# Patient Record
Sex: Male | Born: 1960 | Race: White | Hispanic: No | Marital: Single | Smoking: Current every day smoker
Health system: Southern US, Community
[De-identification: ages and names within clinical notes are randomized; demographics above are authoritative.]

## PROBLEM LIST (undated history)

## (undated) DIAGNOSIS — K759 Inflammatory liver disease, unspecified: Secondary | ICD-10-CM

## (undated) DIAGNOSIS — Z8619 Personal history of other infectious and parasitic diseases: Secondary | ICD-10-CM

## (undated) DIAGNOSIS — I82409 Acute embolism and thrombosis of unspecified deep veins of unspecified lower extremity: Secondary | ICD-10-CM

## (undated) HISTORY — PX: TONSILLECTOMY: SUR1361

## (undated) HISTORY — PX: APPENDECTOMY: SHX54

---

## 1980-05-07 HISTORY — PX: NECK SURGERY: SHX720

## 1998-04-19 ENCOUNTER — Emergency Department (HOSPITAL_COMMUNITY): Admission: EM | Admit: 1998-04-19 | Discharge: 1998-04-19 | Payer: Self-pay | Admitting: Emergency Medicine

## 2007-05-08 HISTORY — PX: IVC FILTER PLACEMENT (ARMC HX): HXRAD1551

## 2007-05-08 HISTORY — PX: LUNG SURGERY: SHX703

## 2008-07-31 ENCOUNTER — Emergency Department: Payer: Self-pay | Admitting: Emergency Medicine

## 2009-02-04 ENCOUNTER — Ambulatory Visit: Payer: Self-pay | Admitting: Internal Medicine

## 2009-02-10 ENCOUNTER — Inpatient Hospital Stay: Payer: Self-pay | Admitting: Internal Medicine

## 2009-02-18 ENCOUNTER — Ambulatory Visit: Payer: Self-pay | Admitting: Internal Medicine

## 2009-02-25 ENCOUNTER — Ambulatory Visit: Payer: Self-pay | Admitting: General Surgery

## 2009-03-02 ENCOUNTER — Inpatient Hospital Stay: Payer: Self-pay | Admitting: General Surgery

## 2009-03-07 ENCOUNTER — Ambulatory Visit: Payer: Self-pay | Admitting: Internal Medicine

## 2009-03-17 ENCOUNTER — Ambulatory Visit: Payer: Self-pay | Admitting: General Surgery

## 2009-04-06 ENCOUNTER — Ambulatory Visit: Payer: Self-pay | Admitting: Internal Medicine

## 2009-05-07 ENCOUNTER — Ambulatory Visit: Payer: Self-pay | Admitting: Internal Medicine

## 2009-06-07 ENCOUNTER — Ambulatory Visit: Payer: Self-pay | Admitting: Internal Medicine

## 2009-07-05 ENCOUNTER — Ambulatory Visit: Payer: Self-pay | Admitting: Internal Medicine

## 2009-07-06 ENCOUNTER — Ambulatory Visit: Payer: Self-pay | Admitting: Internal Medicine

## 2009-08-05 ENCOUNTER — Ambulatory Visit: Payer: Self-pay | Admitting: Internal Medicine

## 2009-09-04 ENCOUNTER — Ambulatory Visit: Payer: Self-pay | Admitting: Internal Medicine

## 2009-10-05 ENCOUNTER — Ambulatory Visit: Payer: Self-pay | Admitting: Internal Medicine

## 2009-11-04 ENCOUNTER — Ambulatory Visit: Payer: Self-pay | Admitting: Internal Medicine

## 2009-12-05 ENCOUNTER — Ambulatory Visit: Payer: Self-pay | Admitting: Internal Medicine

## 2009-12-15 LAB — PSA

## 2010-01-05 ENCOUNTER — Ambulatory Visit: Payer: Self-pay | Admitting: Internal Medicine

## 2010-01-29 ENCOUNTER — Emergency Department: Payer: Self-pay | Admitting: Unknown Physician Specialty

## 2010-02-04 ENCOUNTER — Ambulatory Visit: Payer: Self-pay | Admitting: Internal Medicine

## 2010-03-07 ENCOUNTER — Ambulatory Visit: Payer: Self-pay | Admitting: Internal Medicine

## 2010-04-06 ENCOUNTER — Ambulatory Visit: Payer: Self-pay | Admitting: Internal Medicine

## 2010-05-07 ENCOUNTER — Ambulatory Visit: Payer: Self-pay | Admitting: Internal Medicine

## 2010-06-07 ENCOUNTER — Ambulatory Visit: Payer: Self-pay | Admitting: Internal Medicine

## 2010-07-06 ENCOUNTER — Ambulatory Visit: Payer: Self-pay | Admitting: Internal Medicine

## 2010-08-06 ENCOUNTER — Ambulatory Visit: Payer: Self-pay | Admitting: Internal Medicine

## 2010-09-05 ENCOUNTER — Ambulatory Visit: Payer: Self-pay | Admitting: Internal Medicine

## 2010-10-06 ENCOUNTER — Ambulatory Visit: Payer: Self-pay | Admitting: Internal Medicine

## 2010-11-05 ENCOUNTER — Ambulatory Visit: Payer: Self-pay | Admitting: Internal Medicine

## 2010-11-21 ENCOUNTER — Emergency Department: Payer: Self-pay | Admitting: Emergency Medicine

## 2010-12-06 ENCOUNTER — Ambulatory Visit: Payer: Self-pay | Admitting: Internal Medicine

## 2011-01-06 ENCOUNTER — Ambulatory Visit: Payer: Self-pay | Admitting: Internal Medicine

## 2011-10-25 ENCOUNTER — Inpatient Hospital Stay: Payer: Self-pay | Admitting: Internal Medicine

## 2011-10-25 LAB — BASIC METABOLIC PANEL
Anion Gap: 6 — ABNORMAL LOW (ref 7–16)
BUN: 17 mg/dL (ref 7–18)
Calcium, Total: 9.5 mg/dL (ref 8.5–10.1)
Co2: 27 mmol/L (ref 21–32)
Creatinine: 1.18 mg/dL (ref 0.60–1.30)
EGFR (African American): >60
EGFR (Non-African Amer.): >60
Glucose: 94 mg/dL (ref 65–99)
Potassium: 4.5 mmol/L (ref 3.5–5.1)
Sodium: 134 mmol/L — ABNORMAL LOW (ref 136–145)

## 2011-10-25 LAB — CBC
MCH: 35 pg — ABNORMAL HIGH (ref 26.0–34.0)
MCV: 105 fL — ABNORMAL HIGH (ref 80–100)
Platelet: 85 10*3/uL — ABNORMAL LOW (ref 150–440)
RBC: 4.29 10*6/uL — ABNORMAL LOW (ref 4.40–5.90)
RDW: 14.2 % (ref 11.5–14.5)
WBC: 8.6 10*3/uL (ref 3.8–10.6)

## 2011-10-25 LAB — URINALYSIS, COMPLETE
Bilirubin,UR: NEGATIVE
Ketone: NEGATIVE
Nitrite: NEGATIVE
Ph: 5 (ref 4.5–8.0)
RBC,UR: 11 /HPF (ref 0–5)
Specific Gravity: 1.027 (ref 1.003–1.030)
Squamous Epithelial: 2
WBC UR: 23 /HPF (ref 0–5)

## 2011-10-25 LAB — APTT: Activated PTT: 30.5 secs (ref 23.6–35.9)

## 2011-10-25 LAB — HEPATIC FUNCTION PANEL A (ARMC)
Albumin: 3.8 g/dL (ref 3.4–5.0)
Alkaline Phosphatase: 85 U/L (ref 50–136)
Bilirubin, Direct: 0.2 mg/dL (ref 0.00–0.20)
Bilirubin,Total: 1.2 mg/dL — ABNORMAL HIGH (ref 0.2–1.0)
SGOT(AST): 26 U/L (ref 15–37)

## 2011-10-26 LAB — BASIC METABOLIC PANEL
BUN: 19 mg/dL — ABNORMAL HIGH (ref 7–18)
Calcium, Total: 8.8 mg/dL (ref 8.5–10.1)
Chloride: 102 mmol/L (ref 98–107)
Osmolality: 273 (ref 275–301)
Potassium: 3.5 mmol/L (ref 3.5–5.1)
Sodium: 135 mmol/L — ABNORMAL LOW (ref 136–145)

## 2011-10-26 LAB — CBC WITH DIFFERENTIAL/PLATELET
Basophil #: 0 10*3/uL (ref 0.0–0.1)
Basophil %: 0.5 %
HCT: 39.8 % — ABNORMAL LOW (ref 40.0–52.0)
HGB: 13.3 g/dL (ref 13.0–18.0)
MCV: 104 fL — ABNORMAL HIGH (ref 80–100)
Monocyte #: 0.5 x10 3/mm (ref 0.2–1.0)
Neutrophil %: 64.8 %
Platelet: 73 10*3/uL — ABNORMAL LOW (ref 150–440)
RBC: 3.84 10*6/uL — ABNORMAL LOW (ref 4.40–5.90)
RDW: 14.3 % (ref 11.5–14.5)
WBC: 6.9 10*3/uL (ref 3.8–10.6)

## 2011-10-26 LAB — LIPID PANEL
Cholesterol: 158 mg/dL (ref 0–200)
HDL Cholesterol: 53 mg/dL (ref 40–60)
Ldl Cholesterol, Calc: 74 mg/dL (ref 0–100)

## 2011-10-26 LAB — APTT: Activated PTT: 69.6 secs — ABNORMAL HIGH (ref 23.6–35.9)

## 2011-10-27 LAB — URINE CULTURE

## 2011-10-27 LAB — APTT: Activated PTT: 72.5 secs — ABNORMAL HIGH (ref 23.6–35.9)

## 2011-10-27 LAB — PROTIME-INR: Prothrombin Time: 12.9 secs (ref 11.5–14.7)

## 2011-10-28 LAB — PROTIME-INR: Prothrombin Time: 13.4 secs (ref 11.5–14.7)

## 2011-10-28 LAB — CBC WITH DIFFERENTIAL/PLATELET
Basophil #: 0 10*3/uL (ref 0.0–0.1)
HCT: 39.9 % — ABNORMAL LOW (ref 40.0–52.0)
HGB: 13.2 g/dL (ref 13.0–18.0)
MCH: 34.5 pg — ABNORMAL HIGH (ref 26.0–34.0)
MCHC: 33.1 g/dL (ref 32.0–36.0)
Monocyte #: 0.4 x10 3/mm (ref 0.2–1.0)
RBC: 3.84 10*6/uL — ABNORMAL LOW (ref 4.40–5.90)
RDW: 14 % (ref 11.5–14.5)
WBC: 5.8 10*3/uL (ref 3.8–10.6)

## 2011-10-29 LAB — APTT
Activated PTT: 130.2 secs — ABNORMAL HIGH (ref 23.6–35.9)
Activated PTT: 117.9 secs — ABNORMAL HIGH (ref 23.6–35.9)
Activated PTT: 75.4 secs — ABNORMAL HIGH (ref 23.6–35.9)

## 2011-10-29 LAB — PROTIME-INR: Prothrombin Time: 17.6 secs — ABNORMAL HIGH (ref 11.5–14.7)

## 2011-10-30 LAB — CBC WITH DIFFERENTIAL/PLATELET
Basophil #: 0 10*3/uL (ref 0.0–0.1)
Basophil %: 0.8 %
Eosinophil #: 0.2 10*3/uL (ref 0.0–0.7)
Eosinophil %: 5.2 %
HGB: 13 g/dL (ref 13.0–18.0)
Lymphocyte #: 1.5 10*3/uL (ref 1.0–3.6)
Monocyte #: 0.3 x10 3/mm (ref 0.2–1.0)
Monocyte %: 7.3 %
Neutrophil %: 54.7 %
Platelet: 152 10*3/uL (ref 150–440)
RBC: 3.79 10*6/uL — ABNORMAL LOW (ref 4.40–5.90)
RDW: 14.1 % (ref 11.5–14.5)
WBC: 4.7 10*3/uL (ref 3.8–10.6)

## 2011-10-30 LAB — PROTIME-INR
INR: 1.5
Prothrombin Time: 18.3 secs — ABNORMAL HIGH (ref 11.5–14.7)

## 2011-10-30 LAB — APTT: Activated PTT: 88 secs — ABNORMAL HIGH (ref 23.6–35.9)

## 2011-10-31 LAB — APTT: Activated PTT: 94.7 secs — ABNORMAL HIGH (ref 23.6–35.9)

## 2011-11-01 LAB — APTT
Activated PTT: 60 secs — ABNORMAL HIGH (ref 23.6–35.9)
Activated PTT: 95.4 secs — ABNORMAL HIGH (ref 23.6–35.9)

## 2011-11-02 LAB — PROTIME-INR: INR: 1.9

## 2011-11-02 LAB — APTT: Activated PTT: >160 secs (ref 23.6–35.9)

## 2011-11-03 LAB — APTT: Activated PTT: 92.3 secs — ABNORMAL HIGH (ref 23.6–35.9)

## 2011-11-14 ENCOUNTER — Ambulatory Visit: Payer: Self-pay | Admitting: Internal Medicine

## 2011-11-14 LAB — PROTIME-INR
INR: 2.6
Prothrombin Time: 27.8 secs — ABNORMAL HIGH (ref 11.5–14.7)

## 2011-11-28 LAB — PROTIME-INR
INR: 3
Prothrombin Time: 30.9 secs — ABNORMAL HIGH (ref 11.5–14.7)

## 2011-12-05 LAB — PROTIME-INR
INR: 3.8
Prothrombin Time: 37.6 secs — ABNORMAL HIGH (ref 11.5–14.7)

## 2011-12-06 ENCOUNTER — Ambulatory Visit: Payer: Self-pay | Admitting: Internal Medicine

## 2011-12-12 LAB — PROTIME-INR
INR: 2.7
Prothrombin Time: 29.1 secs — ABNORMAL HIGH (ref 11.5–14.7)

## 2011-12-19 LAB — HEPATIC FUNCTION PANEL A (ARMC)
Albumin: 3.4 g/dL (ref 3.4–5.0)
Alkaline Phosphatase: 67 U/L (ref 50–136)
Bilirubin, Direct: 0.1 mg/dL (ref 0.00–0.20)
Bilirubin,Total: 0.6 mg/dL (ref 0.2–1.0)
SGPT (ALT): 19 U/L (ref 12–78)

## 2011-12-19 LAB — CBC CANCER CENTER
Eosinophil #: 0 x10 3/mm (ref 0.0–0.7)
Eosinophil %: 0.8 %
MCH: 34.7 pg — ABNORMAL HIGH (ref 26.0–34.0)
Monocyte #: 0.4 x10 3/mm (ref 0.2–1.0)
Monocyte %: 7 %
Neutrophil %: 70 %
Platelet: 162 x10 3/mm (ref 150–440)
RBC: 4.43 10*6/uL (ref 4.40–5.90)
WBC: 5.4 x10 3/mm (ref 3.8–10.6)

## 2011-12-19 LAB — CREATININE, SERUM
Creatinine: 1.08 mg/dL (ref 0.60–1.30)
EGFR (Non-African Amer.): >60

## 2011-12-19 LAB — PROTIME-INR
INR: 1.3
Prothrombin Time: 16.7 secs — ABNORMAL HIGH (ref 11.5–14.7)

## 2011-12-28 LAB — PROTIME-INR
INR: 3.2
Prothrombin Time: 32.8 secs — ABNORMAL HIGH (ref 11.5–14.7)

## 2012-01-06 ENCOUNTER — Ambulatory Visit: Payer: Self-pay | Admitting: Internal Medicine

## 2012-01-09 LAB — PROTIME-INR: Prothrombin Time: 35.3 secs — ABNORMAL HIGH (ref 11.5–14.7)

## 2012-01-16 LAB — PROTIME-INR: INR: 2

## 2012-01-23 LAB — PROTIME-INR
INR: 1.9
Prothrombin Time: 21.9 secs — ABNORMAL HIGH (ref 11.5–14.7)

## 2012-02-05 ENCOUNTER — Ambulatory Visit: Payer: Self-pay | Admitting: Internal Medicine

## 2012-02-15 LAB — PROTIME-INR
INR: 1.8
Prothrombin Time: 21.4 secs — ABNORMAL HIGH (ref 11.5–14.7)

## 2012-02-15 LAB — CBC CANCER CENTER
Basophil #: 0 x10 3/mm (ref 0.0–0.1)
Basophil %: 1.3 %
Lymphocyte #: 1.3 x10 3/mm (ref 1.0–3.6)
MCH: 33.4 pg (ref 26.0–34.0)
MCHC: 32.7 g/dL (ref 32.0–36.0)
MCV: 102 fL — ABNORMAL HIGH (ref 80–100)
Monocyte #: 0.3 x10 3/mm (ref 0.2–1.0)
Monocyte %: 9.8 %
Neutrophil #: 1.6 x10 3/mm (ref 1.4–6.5)
RBC: 4.29 10*6/uL — ABNORMAL LOW (ref 4.40–5.90)
RDW: 16.8 % — ABNORMAL HIGH (ref 11.5–14.5)

## 2012-02-15 LAB — HEPATIC FUNCTION PANEL A (ARMC)
Albumin: 3.5 g/dL (ref 3.4–5.0)
Bilirubin, Direct: 0.1 mg/dL (ref 0.00–0.20)
Bilirubin,Total: 0.4 mg/dL (ref 0.2–1.0)
SGPT (ALT): 29 U/L (ref 12–78)
Total Protein: 7.9 g/dL (ref 6.4–8.2)

## 2012-02-15 LAB — CREATININE, SERUM
Creatinine: 1.17 mg/dL (ref 0.60–1.30)
EGFR (African American): >60

## 2012-02-22 LAB — PROTIME-INR
INR: 2.4
Prothrombin Time: 26.7 s — ABNORMAL HIGH

## 2012-03-07 ENCOUNTER — Ambulatory Visit: Payer: Self-pay | Admitting: Internal Medicine

## 2012-03-07 LAB — PROTIME-INR: Prothrombin Time: 32.1 secs — ABNORMAL HIGH (ref 11.5–14.7)

## 2012-03-21 LAB — PROTIME-INR: INR: 2.6

## 2012-04-06 ENCOUNTER — Ambulatory Visit: Payer: Self-pay | Admitting: Internal Medicine

## 2012-04-17 LAB — PROTIME-INR: INR: 2.6

## 2012-05-02 LAB — PROTIME-INR
INR: 2.1
Prothrombin Time: 23.6 secs — ABNORMAL HIGH (ref 11.5–14.7)

## 2012-05-07 ENCOUNTER — Ambulatory Visit: Payer: Self-pay | Admitting: Internal Medicine

## 2012-05-16 ENCOUNTER — Ambulatory Visit: Payer: Self-pay | Admitting: Internal Medicine

## 2012-05-16 LAB — CREATININE, SERUM
Creatinine: 1.03 mg/dL (ref 0.60–1.30)
EGFR (African American): >60
EGFR (Non-African Amer.): >60

## 2012-05-16 LAB — HEPATIC FUNCTION PANEL A (ARMC)
Albumin: 3.7 g/dL (ref 3.4–5.0)
Bilirubin,Total: 0.6 mg/dL (ref 0.2–1.0)
SGOT(AST): 23 U/L (ref 15–37)
SGPT (ALT): 21 U/L (ref 12–78)
Total Protein: 7.6 g/dL (ref 6.4–8.2)

## 2012-05-16 LAB — CBC CANCER CENTER
Basophil #: 0 x10 3/mm (ref 0.0–0.1)
Basophil %: 0.6 %
Eosinophil %: 1.2 %
HCT: 42.1 % (ref 40.0–52.0)
HGB: 14.2 g/dL (ref 13.0–18.0)
Lymphocyte #: 1.2 x10 3/mm (ref 1.0–3.6)
Lymphocyte %: 25.1 %
MCHC: 33.7 g/dL (ref 32.0–36.0)
Monocyte #: 0.2 x10 3/mm (ref 0.2–1.0)
Neutrophil #: 3.3 x10 3/mm (ref 1.4–6.5)
Platelet: 143 x10 3/mm — ABNORMAL LOW (ref 150–440)
RBC: 3.89 10*6/uL — ABNORMAL LOW (ref 4.40–5.90)
RDW: 16.1 % — ABNORMAL HIGH (ref 11.5–14.5)

## 2012-05-16 LAB — PROTIME-INR: Prothrombin Time: 23 secs — ABNORMAL HIGH (ref 11.5–14.7)

## 2012-06-06 LAB — CBC
MCHC: 32.8 g/dL (ref 32.0–36.0)
RDW: 16.1 % — ABNORMAL HIGH (ref 11.5–14.5)
WBC: 5.1 10*3/uL (ref 3.8–10.6)

## 2012-06-06 LAB — COMPREHENSIVE METABOLIC PANEL
Albumin: 3.4 g/dL (ref 3.4–5.0)
Alkaline Phosphatase: 72 U/L (ref 50–136)
BUN: 9 mg/dL (ref 7–18)
Bilirubin,Total: 0.6 mg/dL (ref 0.2–1.0)
Chloride: 106 mmol/L (ref 98–107)
Glucose: 88 mg/dL (ref 65–99)
SGOT(AST): 29 U/L (ref 15–37)
Sodium: 138 mmol/L (ref 136–145)
Total Protein: 7 g/dL (ref 6.4–8.2)

## 2012-06-06 LAB — PROTIME-INR: Prothrombin Time: 27.2 secs — ABNORMAL HIGH (ref 11.5–14.7)

## 2012-06-06 LAB — APTT: Activated PTT: 43.6 secs — ABNORMAL HIGH (ref 23.6–35.9)

## 2012-06-07 ENCOUNTER — Observation Stay: Payer: Self-pay | Admitting: Student

## 2012-06-07 ENCOUNTER — Ambulatory Visit: Payer: Self-pay | Admitting: Internal Medicine

## 2012-06-08 LAB — PROTIME-INR: Prothrombin Time: 27.1 secs — ABNORMAL HIGH (ref 11.5–14.7)

## 2012-06-10 ENCOUNTER — Ambulatory Visit: Payer: Self-pay | Admitting: Internal Medicine

## 2012-06-13 LAB — CBC CANCER CENTER
Basophil %: 1.4 %
HGB: 13.9 g/dL (ref 13.0–18.0)
Lymphocyte %: 31.2 %
MCHC: 33.3 g/dL (ref 32.0–36.0)
MCV: 109 fL — ABNORMAL HIGH (ref 80–100)
Monocyte #: 0.4 x10 3/mm (ref 0.2–1.0)
Monocyte %: 9.4 %
Neutrophil #: 2.3 x10 3/mm (ref 1.4–6.5)
Platelet: 187 x10 3/mm (ref 150–440)
RDW: 15.7 % — ABNORMAL HIGH (ref 11.5–14.5)

## 2012-06-13 LAB — CREATININE, SERUM
Creatinine: 1.04 mg/dL (ref 0.60–1.30)
EGFR (African American): >60

## 2012-07-05 ENCOUNTER — Ambulatory Visit: Payer: Self-pay | Admitting: Internal Medicine

## 2012-07-18 ENCOUNTER — Ambulatory Visit: Payer: Self-pay | Admitting: Internal Medicine

## 2012-07-18 LAB — CREATININE, SERUM
Creatinine: 1.14 mg/dL (ref 0.60–1.30)
EGFR (African American): >60

## 2012-07-18 LAB — HEPATIC FUNCTION PANEL A (ARMC)
Alkaline Phosphatase: 66 U/L (ref 50–136)
Bilirubin,Total: 0.3 mg/dL (ref 0.2–1.0)
SGOT(AST): 59 U/L — ABNORMAL HIGH (ref 15–37)
SGPT (ALT): 76 U/L (ref 12–78)

## 2012-07-18 LAB — CBC CANCER CENTER
Basophil #: 0.1 x10 3/mm (ref 0.0–0.1)
Basophil %: 1.4 %
Eosinophil %: 3.3 %
Lymphocyte %: 32 %
MCHC: 33.6 g/dL (ref 32.0–36.0)
Monocyte #: 0.4 x10 3/mm (ref 0.2–1.0)
RBC: 3.85 10*6/uL — ABNORMAL LOW (ref 4.40–5.90)
RDW: 13.8 % (ref 11.5–14.5)
WBC: 4.1 x10 3/mm (ref 3.8–10.6)

## 2012-08-01 ENCOUNTER — Emergency Department: Payer: Self-pay | Admitting: Emergency Medicine

## 2012-08-01 LAB — BASIC METABOLIC PANEL
Anion Gap: 4 — ABNORMAL LOW (ref 7–16)
BUN: 17 mg/dL (ref 7–18)
Chloride: 102 mmol/L (ref 98–107)
Creatinine: 1.01 mg/dL (ref 0.60–1.30)
EGFR (African American): >60
EGFR (Non-African Amer.): >60
Glucose: 97 mg/dL (ref 65–99)
Potassium: 3.5 mmol/L (ref 3.5–5.1)

## 2012-08-01 LAB — CBC
HCT: 40.1 % (ref 40.0–52.0)
HGB: 13.4 g/dL (ref 13.0–18.0)
MCH: 34.8 pg — ABNORMAL HIGH (ref 26.0–34.0)
MCHC: 33.5 g/dL (ref 32.0–36.0)
MCV: 104 fL — ABNORMAL HIGH (ref 80–100)
RDW: 14.2 % (ref 11.5–14.5)
WBC: 6.2 10*3/uL (ref 3.8–10.6)

## 2012-08-01 LAB — TROPONIN I: Troponin-I: <0.02 ng/mL

## 2012-08-05 ENCOUNTER — Ambulatory Visit: Payer: Self-pay | Admitting: Internal Medicine

## 2012-08-29 LAB — CBC CANCER CENTER
Basophil %: 0.9 %
Eosinophil #: 0.1 x10 3/mm (ref 0.0–0.7)
Eosinophil %: 3.6 %
HCT: 41.5 % (ref 40.0–52.0)
Lymphocyte #: 0.9 x10 3/mm — ABNORMAL LOW (ref 1.0–3.6)
Lymphocyte %: 24.5 %
MCH: 33.9 pg (ref 26.0–34.0)
MCHC: 33.1 g/dL (ref 32.0–36.0)
MCV: 103 fL — ABNORMAL HIGH (ref 80–100)
Monocyte #: 0.3 x10 3/mm (ref 0.2–1.0)
Monocyte %: 7.5 %
Neutrophil #: 2.2 x10 3/mm (ref 1.4–6.5)
Neutrophil %: 63.5 %
Platelet: 100 x10 3/mm — ABNORMAL LOW (ref 150–440)
RDW: 15 % — ABNORMAL HIGH (ref 11.5–14.5)

## 2012-08-29 LAB — HEPATIC FUNCTION PANEL A (ARMC)
Albumin: 3.3 g/dL — ABNORMAL LOW (ref 3.4–5.0)
Alkaline Phosphatase: 60 U/L (ref 50–136)
Bilirubin, Direct: 0.1 mg/dL (ref 0.00–0.20)
Bilirubin,Total: 0.5 mg/dL (ref 0.2–1.0)
SGOT(AST): 25 U/L (ref 15–37)
SGPT (ALT): 27 U/L (ref 12–78)

## 2012-08-29 LAB — CREATININE, SERUM
Creatinine: 1.24 mg/dL (ref 0.60–1.30)
EGFR (African American): >60
EGFR (Non-African Amer.): >60

## 2012-09-04 ENCOUNTER — Ambulatory Visit: Payer: Self-pay | Admitting: Internal Medicine

## 2012-09-19 ENCOUNTER — Observation Stay: Payer: Self-pay | Admitting: Internal Medicine

## 2012-09-19 LAB — PROTIME-INR
INR: 0.9
Prothrombin Time: 12.4 secs (ref 11.5–14.7)

## 2012-09-19 LAB — CBC WITH DIFFERENTIAL/PLATELET
Eosinophil %: 0.8 %
HCT: 39.5 % — ABNORMAL LOW (ref 40.0–52.0)
Lymphocyte #: 1 10*3/uL (ref 1.0–3.6)
Lymphocyte %: 14 %
MCHC: 34.3 g/dL (ref 32.0–36.0)
MCV: 103 fL — ABNORMAL HIGH (ref 80–100)
Monocyte #: 0.5 x10 3/mm (ref 0.2–1.0)
Monocyte %: 6.9 %
Neutrophil #: 5.3 10*3/uL (ref 1.4–6.5)
Neutrophil %: 77.2 %
RBC: 3.83 10*6/uL — ABNORMAL LOW (ref 4.40–5.90)
RDW: 15.8 % — ABNORMAL HIGH (ref 11.5–14.5)
WBC: 6.8 10*3/uL (ref 3.8–10.6)

## 2012-09-19 LAB — COMPREHENSIVE METABOLIC PANEL
Anion Gap: 4 — ABNORMAL LOW (ref 7–16)
BUN: 13 mg/dL (ref 7–18)
Bilirubin,Total: 0.5 mg/dL (ref 0.2–1.0)
Co2: 28 mmol/L (ref 21–32)
EGFR (African American): >60
Osmolality: 267 (ref 275–301)
SGOT(AST): 14 U/L — ABNORMAL LOW (ref 15–37)
SGPT (ALT): 20 U/L (ref 12–78)
Sodium: 133 mmol/L — ABNORMAL LOW (ref 136–145)
Total Protein: 8.1 g/dL (ref 6.4–8.2)

## 2012-09-19 LAB — APTT: Activated PTT: 37.8 secs — ABNORMAL HIGH (ref 23.6–35.9)

## 2012-09-20 LAB — CBC WITH DIFFERENTIAL/PLATELET
Basophil #: 0 10*3/uL (ref 0.0–0.1)
Basophil %: 0.9 %
Eosinophil %: 2.1 %
HGB: 12.3 g/dL — ABNORMAL LOW (ref 13.0–18.0)
MCV: 102 fL — ABNORMAL HIGH (ref 80–100)
Monocyte #: 0.5 x10 3/mm (ref 0.2–1.0)
Monocyte %: 8.9 %
Neutrophil %: 69.3 %
Platelet: 121 10*3/uL — ABNORMAL LOW (ref 150–440)
RBC: 3.51 10*6/uL — ABNORMAL LOW (ref 4.40–5.90)
WBC: 5.4 10*3/uL (ref 3.8–10.6)

## 2012-09-25 LAB — CULTURE, BLOOD (SINGLE)

## 2012-09-30 LAB — CBC CANCER CENTER
Eosinophil #: 0.1 x10 3/mm (ref 0.0–0.7)
Eosinophil %: 1.3 %
HCT: 39.9 % — ABNORMAL LOW (ref 40.0–52.0)
HGB: 13.5 g/dL (ref 13.0–18.0)
Lymphocyte %: 25.2 %
MCH: 34.5 pg — ABNORMAL HIGH (ref 26.0–34.0)
MCV: 102 fL — ABNORMAL HIGH (ref 80–100)
Monocyte #: 0.3 x10 3/mm (ref 0.2–1.0)
Monocyte %: 5.9 %
Neutrophil %: 66.8 %
RDW: 16 % — ABNORMAL HIGH (ref 11.5–14.5)

## 2012-09-30 LAB — HEPATIC FUNCTION PANEL A (ARMC)
Albumin: 3.2 g/dL — ABNORMAL LOW (ref 3.4–5.0)
Alkaline Phosphatase: 63 U/L (ref 50–136)
Bilirubin, Direct: 0.1 mg/dL (ref 0.00–0.20)
Bilirubin,Total: 0.3 mg/dL (ref 0.2–1.0)
SGPT (ALT): 24 U/L (ref 12–78)

## 2012-09-30 LAB — CREATININE, SERUM
EGFR (African American): >60
EGFR (Non-African Amer.): >60

## 2012-10-05 ENCOUNTER — Ambulatory Visit: Payer: Self-pay | Admitting: Internal Medicine

## 2012-10-05 ENCOUNTER — Emergency Department: Payer: Self-pay | Admitting: Emergency Medicine

## 2012-10-27 LAB — CBC CANCER CENTER
Basophil #: 0 x10 3/mm (ref 0.0–0.1)
Eosinophil #: 0.1 x10 3/mm (ref 0.0–0.7)
HCT: 41.8 % (ref 40.0–52.0)
Lymphocyte #: 1.1 x10 3/mm (ref 1.0–3.6)
Lymphocyte %: 31 %
MCH: 34.7 pg — ABNORMAL HIGH (ref 26.0–34.0)
MCHC: 34.1 g/dL (ref 32.0–36.0)
MCV: 102 fL — ABNORMAL HIGH (ref 80–100)
Neutrophil #: 2.1 x10 3/mm (ref 1.4–6.5)
Platelet: 142 x10 3/mm — ABNORMAL LOW (ref 150–440)
WBC: 3.7 x10 3/mm — ABNORMAL LOW (ref 3.8–10.6)

## 2012-10-27 LAB — HEPATIC FUNCTION PANEL A (ARMC)
Albumin: 3.5 g/dL (ref 3.4–5.0)
Alkaline Phosphatase: 58 U/L (ref 50–136)
Bilirubin,Total: 0.7 mg/dL (ref 0.2–1.0)
SGPT (ALT): 28 U/L (ref 12–78)
Total Protein: 7.5 g/dL (ref 6.4–8.2)

## 2012-10-27 LAB — CREATININE, SERUM: EGFR (Non-African Amer.): >60

## 2012-11-04 ENCOUNTER — Ambulatory Visit: Payer: Self-pay | Admitting: Internal Medicine

## 2012-12-01 LAB — CBC CANCER CENTER
Basophil #: 0 x10 3/mm (ref 0.0–0.1)
Basophil %: 0.4 %
Eosinophil #: 0 x10 3/mm (ref 0.0–0.7)
Eosinophil %: 0.9 %
HCT: 45.8 % (ref 40.0–52.0)
HGB: 15.5 g/dL (ref 13.0–18.0)
MCH: 34.5 pg — ABNORMAL HIGH (ref 26.0–34.0)
MCHC: 33.8 g/dL (ref 32.0–36.0)
MCV: 102 fL — ABNORMAL HIGH (ref 80–100)
Neutrophil %: 70.7 %
Platelet: 127 x10 3/mm — ABNORMAL LOW (ref 150–440)
RBC: 4.49 10*6/uL (ref 4.40–5.90)
WBC: 5.2 x10 3/mm (ref 3.8–10.6)

## 2012-12-01 LAB — HEPATIC FUNCTION PANEL A (ARMC)
Bilirubin, Direct: 0.1 mg/dL (ref 0.00–0.20)
Bilirubin,Total: 0.7 mg/dL (ref 0.2–1.0)
SGOT(AST): 25 U/L (ref 15–37)
SGPT (ALT): 33 U/L (ref 12–78)

## 2012-12-01 LAB — CREATININE, SERUM
EGFR (African American): >60
EGFR (Non-African Amer.): >60

## 2012-12-05 ENCOUNTER — Ambulatory Visit: Payer: Self-pay | Admitting: Internal Medicine

## 2012-12-29 LAB — CBC CANCER CENTER
Basophil %: 1.2 %
Eosinophil #: 0 x10 3/mm (ref 0.0–0.7)
HCT: 46.8 % (ref 40.0–52.0)
HGB: 16.3 g/dL (ref 13.0–18.0)
Lymphocyte #: 2 x10 3/mm (ref 1.0–3.6)
Neutrophil #: 3.3 x10 3/mm (ref 1.4–6.5)
Neutrophil %: 57.7 %
Platelet: 159 x10 3/mm (ref 150–440)
RBC: 4.65 10*6/uL (ref 4.40–5.90)
RDW: 14.9 % — ABNORMAL HIGH (ref 11.5–14.5)

## 2012-12-29 LAB — CREATININE, SERUM
EGFR (African American): >60
EGFR (Non-African Amer.): >60

## 2012-12-29 LAB — HEPATIC FUNCTION PANEL A (ARMC)
Alkaline Phosphatase: 67 U/L (ref 50–136)
Bilirubin, Direct: 0.1 mg/dL (ref 0.00–0.20)
Total Protein: 8 g/dL (ref 6.4–8.2)

## 2013-01-05 ENCOUNTER — Ambulatory Visit: Payer: Self-pay | Admitting: Internal Medicine

## 2013-01-26 LAB — HEPATIC FUNCTION PANEL A (ARMC)
Albumin: 3.4 g/dL (ref 3.4–5.0)
SGPT (ALT): 28 U/L (ref 12–78)
Total Protein: 7.1 g/dL (ref 6.4–8.2)

## 2013-01-26 LAB — CREATININE, SERUM
EGFR (African American): >60
EGFR (Non-African Amer.): >60

## 2013-01-26 LAB — CBC CANCER CENTER
Eosinophil #: 0.1 x10 3/mm (ref 0.0–0.7)
Eosinophil %: 1.5 %
HGB: 13.8 g/dL (ref 13.0–18.0)
Lymphocyte #: 1.8 x10 3/mm (ref 1.0–3.6)
Lymphocyte %: 31.6 %
MCH: 34.5 pg — ABNORMAL HIGH (ref 26.0–34.0)
Platelet: 147 x10 3/mm — ABNORMAL LOW (ref 150–440)
RBC: 4.01 10*6/uL — ABNORMAL LOW (ref 4.40–5.90)
RDW: 14.8 % — ABNORMAL HIGH (ref 11.5–14.5)
WBC: 5.6 x10 3/mm (ref 3.8–10.6)

## 2013-02-04 ENCOUNTER — Ambulatory Visit: Payer: Self-pay | Admitting: Internal Medicine

## 2013-02-23 LAB — CBC CANCER CENTER
Basophil #: 0.1 x10 3/mm (ref 0.0–0.1)
Eosinophil %: 1.4 %
HCT: 41.7 % (ref 40.0–52.0)
HGB: 14.6 g/dL (ref 13.0–18.0)
Lymphocyte #: 1.7 x10 3/mm (ref 1.0–3.6)
MCH: 35.9 pg — ABNORMAL HIGH (ref 26.0–34.0)
MCHC: 35 g/dL (ref 32.0–36.0)
MCV: 103 fL — ABNORMAL HIGH (ref 80–100)
Monocyte #: 0.3 x10 3/mm (ref 0.2–1.0)
Monocyte %: 5.6 %
Neutrophil %: 62.9 %
Platelet: 127 x10 3/mm — ABNORMAL LOW (ref 150–440)
RDW: 14.2 % (ref 11.5–14.5)
WBC: 5.9 x10 3/mm (ref 3.8–10.6)

## 2013-02-23 LAB — CREATININE, SERUM
Creatinine: 0.89 mg/dL (ref 0.60–1.30)
EGFR (African American): >60
EGFR (Non-African Amer.): >60

## 2013-02-23 LAB — HEPATIC FUNCTION PANEL A (ARMC)
Alkaline Phosphatase: 63 U/L (ref 50–136)
Bilirubin, Direct: <0.1 mg/dL (ref 0.00–0.20)
Bilirubin,Total: 0.8 mg/dL (ref 0.2–1.0)
SGOT(AST): 48 U/L — ABNORMAL HIGH (ref 15–37)
SGPT (ALT): 73 U/L (ref 12–78)

## 2013-03-07 ENCOUNTER — Ambulatory Visit: Payer: Self-pay | Admitting: Internal Medicine

## 2013-04-10 ENCOUNTER — Ambulatory Visit: Payer: Self-pay | Admitting: Internal Medicine

## 2013-04-10 LAB — CBC CANCER CENTER
Basophil %: 1.8 %
Eosinophil #: 0.1 x10 3/mm (ref 0.0–0.7)
Eosinophil %: 1 %
Lymphocyte #: 1.7 x10 3/mm (ref 1.0–3.6)
MCHC: 34.1 g/dL (ref 32.0–36.0)
MCV: 104 fL — ABNORMAL HIGH (ref 80–100)
Monocyte #: 0.2 x10 3/mm (ref 0.2–1.0)
Monocyte %: 3.7 %
Neutrophil #: 3 x10 3/mm (ref 1.4–6.5)
Neutrophil %: 59.1 %
Platelet: 119 x10 3/mm — ABNORMAL LOW (ref 150–440)
RBC: 4.04 10*6/uL — ABNORMAL LOW (ref 4.40–5.90)
WBC: 5 x10 3/mm (ref 3.8–10.6)

## 2013-04-10 LAB — HEPATIC FUNCTION PANEL A (ARMC)
Albumin: 3.1 g/dL — ABNORMAL LOW (ref 3.4–5.0)
Alkaline Phosphatase: 113 U/L
Bilirubin, Direct: 0.1 mg/dL (ref 0.00–0.20)

## 2013-04-10 LAB — CREATININE, SERUM
Creatinine: 1.4 mg/dL — ABNORMAL HIGH (ref 0.60–1.30)
EGFR (Non-African Amer.): 57 — ABNORMAL LOW

## 2013-04-20 LAB — CBC CANCER CENTER
Basophil #: 0 x10 3/mm (ref 0.0–0.1)
Eosinophil %: 1.7 %
HCT: 37.6 % — ABNORMAL LOW (ref 40.0–52.0)
Lymphocyte #: 1.3 x10 3/mm (ref 1.0–3.6)
Lymphocyte %: 34.6 %
MCHC: 32.6 g/dL (ref 32.0–36.0)
MCV: 106 fL — ABNORMAL HIGH (ref 80–100)
Neutrophil %: 55.5 %
Platelet: 155 x10 3/mm (ref 150–440)
RBC: 3.54 10*6/uL — ABNORMAL LOW (ref 4.40–5.90)

## 2013-04-20 LAB — HEPATIC FUNCTION PANEL A (ARMC)
Albumin: 3.1 g/dL — ABNORMAL LOW (ref 3.4–5.0)
Alkaline Phosphatase: 65 U/L
Bilirubin,Total: 0.4 mg/dL (ref 0.2–1.0)
SGOT(AST): 25 U/L (ref 15–37)
SGPT (ALT): 37 U/L (ref 12–78)

## 2013-04-20 LAB — CREATININE, SERUM
EGFR (African American): >60
EGFR (Non-African Amer.): >60

## 2013-05-07 ENCOUNTER — Ambulatory Visit: Payer: Self-pay | Admitting: Internal Medicine

## 2013-05-08 LAB — CREATININE, SERUM
Creatinine: 1.06 mg/dL (ref 0.60–1.30)
EGFR (Non-African Amer.): >60

## 2013-05-08 LAB — CBC CANCER CENTER
BASOS ABS: 0.1 x10 3/mm (ref 0.0–0.1)
BASOS PCT: 1 %
EOS ABS: 0.1 x10 3/mm (ref 0.0–0.7)
EOS PCT: 1.8 %
HCT: 40.8 % (ref 40.0–52.0)
HGB: 13.5 g/dL (ref 13.0–18.0)
LYMPHS ABS: 1.3 x10 3/mm (ref 1.0–3.6)
Lymphocyte %: 25.7 %
MCH: 34.5 pg — ABNORMAL HIGH (ref 26.0–34.0)
MCHC: 33.1 g/dL (ref 32.0–36.0)
MCV: 104 fL — ABNORMAL HIGH (ref 80–100)
MONOS PCT: 5 %
Monocyte #: 0.3 x10 3/mm (ref 0.2–1.0)
Neutrophil #: 3.5 x10 3/mm (ref 1.4–6.5)
Neutrophil %: 66.5 %
PLATELETS: 127 x10 3/mm — AB (ref 150–440)
RBC: 3.92 10*6/uL — AB (ref 4.40–5.90)
RDW: 15.8 % — ABNORMAL HIGH (ref 11.5–14.5)
WBC: 5.2 x10 3/mm (ref 3.8–10.6)

## 2013-05-08 LAB — HEPATIC FUNCTION PANEL A (ARMC)
ALK PHOS: 47 U/L
ALT: 30 U/L (ref 12–78)
Albumin: 3.5 g/dL (ref 3.4–5.0)
Bilirubin, Direct: 0.1 mg/dL (ref 0.00–0.20)
Bilirubin,Total: 0.4 mg/dL (ref 0.2–1.0)
SGOT(AST): 23 U/L (ref 15–37)
Total Protein: 7.2 g/dL (ref 6.4–8.2)

## 2013-06-05 LAB — HEPATIC FUNCTION PANEL A (ARMC)
AST: 26 U/L (ref 15–37)
Albumin: 3.5 g/dL (ref 3.4–5.0)
Alkaline Phosphatase: 41 U/L — ABNORMAL LOW
BILIRUBIN TOTAL: 0.4 mg/dL (ref 0.2–1.0)
Bilirubin, Direct: <0.1 mg/dL (ref 0.00–0.20)
SGPT (ALT): 26 U/L (ref 12–78)
Total Protein: 7.7 g/dL (ref 6.4–8.2)

## 2013-06-05 LAB — CBC CANCER CENTER
BASOS PCT: 1 %
Basophil #: 0.1 x10 3/mm (ref 0.0–0.1)
EOS ABS: 0.1 x10 3/mm (ref 0.0–0.7)
Eosinophil %: 1.6 %
HCT: 41.4 % (ref 40.0–52.0)
HGB: 13.5 g/dL (ref 13.0–18.0)
LYMPHS PCT: 29.2 %
Lymphocyte #: 1.5 x10 3/mm (ref 1.0–3.6)
MCH: 34.8 pg — ABNORMAL HIGH (ref 26.0–34.0)
MCHC: 32.5 g/dL (ref 32.0–36.0)
MCV: 107 fL — ABNORMAL HIGH (ref 80–100)
Monocyte #: 0.4 x10 3/mm (ref 0.2–1.0)
Monocyte %: 7.2 %
Neutrophil #: 3.2 x10 3/mm (ref 1.4–6.5)
Neutrophil %: 61 %
PLATELETS: 144 x10 3/mm — AB (ref 150–440)
RBC: 3.87 10*6/uL — ABNORMAL LOW (ref 4.40–5.90)
RDW: 15.3 % — ABNORMAL HIGH (ref 11.5–14.5)
WBC: 5.2 x10 3/mm (ref 3.8–10.6)

## 2013-06-05 LAB — CREATININE, SERUM
CREATININE: 1.34 mg/dL — AB (ref 0.60–1.30)
EGFR (Non-African Amer.): 60 — ABNORMAL LOW

## 2013-06-07 ENCOUNTER — Ambulatory Visit: Payer: Self-pay | Admitting: Internal Medicine

## 2013-07-08 ENCOUNTER — Ambulatory Visit: Payer: Self-pay | Admitting: Internal Medicine

## 2013-07-08 LAB — CBC CANCER CENTER
Basophil #: 0 x10 3/mm (ref 0.0–0.1)
Basophil %: 0.8 %
Eosinophil #: 0.1 x10 3/mm (ref 0.0–0.7)
Eosinophil %: 2.7 %
HCT: 41.7 % (ref 40.0–52.0)
HGB: 14.5 g/dL (ref 13.0–18.0)
LYMPHS ABS: 1.7 x10 3/mm (ref 1.0–3.6)
Lymphocyte %: 36.4 %
MCH: 36 pg — ABNORMAL HIGH (ref 26.0–34.0)
MCHC: 34.8 g/dL (ref 32.0–36.0)
MCV: 103 fL — AB (ref 80–100)
Monocyte #: 0.2 x10 3/mm (ref 0.2–1.0)
Monocyte %: 5.2 %
NEUTROS PCT: 54.9 %
Neutrophil #: 2.6 x10 3/mm (ref 1.4–6.5)
Platelet: 177 x10 3/mm (ref 150–440)
RBC: 4.03 10*6/uL — ABNORMAL LOW (ref 4.40–5.90)
RDW: 14.8 % — ABNORMAL HIGH (ref 11.5–14.5)
WBC: 4.7 x10 3/mm (ref 3.8–10.6)

## 2013-07-08 LAB — HEPATIC FUNCTION PANEL A (ARMC)
Albumin: 3.7 g/dL (ref 3.4–5.0)
Alkaline Phosphatase: 54 U/L
Bilirubin, Direct: <0.1 mg/dL (ref 0.00–0.20)
Bilirubin,Total: 0.5 mg/dL (ref 0.2–1.0)
SGOT(AST): 44 U/L — ABNORMAL HIGH (ref 15–37)
SGPT (ALT): 53 U/L (ref 12–78)
Total Protein: 8.2 g/dL (ref 6.4–8.2)

## 2013-07-08 LAB — CREATININE, SERUM
CREATININE: 1.05 mg/dL (ref 0.60–1.30)
EGFR (Non-African Amer.): >60

## 2013-08-05 ENCOUNTER — Ambulatory Visit: Payer: Self-pay | Admitting: Internal Medicine

## 2013-08-05 LAB — CBC CANCER CENTER
Basophil #: 0.1 x10 3/mm (ref 0.0–0.1)
Basophil %: 1.6 %
EOS PCT: 1.7 %
Eosinophil #: 0.1 x10 3/mm (ref 0.0–0.7)
HCT: 41.4 % (ref 40.0–52.0)
HGB: 14 g/dL (ref 13.0–18.0)
Lymphocyte #: 2 x10 3/mm (ref 1.0–3.6)
Lymphocyte %: 47.6 %
MCH: 34.7 pg — ABNORMAL HIGH (ref 26.0–34.0)
MCHC: 33.8 g/dL (ref 32.0–36.0)
MCV: 103 fL — AB (ref 80–100)
Monocyte #: 0.4 x10 3/mm (ref 0.2–1.0)
Monocyte %: 8.6 %
NEUTROS ABS: 1.7 x10 3/mm (ref 1.4–6.5)
NEUTROS PCT: 40.5 %
PLATELETS: 140 x10 3/mm — AB (ref 150–440)
RBC: 4.03 10*6/uL — ABNORMAL LOW (ref 4.40–5.90)
RDW: 15.4 % — ABNORMAL HIGH (ref 11.5–14.5)
WBC: 4.1 x10 3/mm (ref 3.8–10.6)

## 2013-08-05 LAB — CREATININE, SERUM
Creatinine: 1.17 mg/dL (ref 0.60–1.30)
EGFR (Non-African Amer.): >60

## 2013-08-05 LAB — HEPATIC FUNCTION PANEL A (ARMC)
ALBUMIN: 3.7 g/dL (ref 3.4–5.0)
ALK PHOS: 60 U/L
ALT: 41 U/L (ref 12–78)
BILIRUBIN DIRECT: 0.1 mg/dL (ref 0.00–0.20)
Bilirubin,Total: 0.6 mg/dL (ref 0.2–1.0)
SGOT(AST): 42 U/L — ABNORMAL HIGH (ref 15–37)
Total Protein: 7.9 g/dL (ref 6.4–8.2)

## 2013-09-01 LAB — CBC CANCER CENTER
BASOS PCT: 1.1 %
Basophil #: 0.1 x10 3/mm (ref 0.0–0.1)
Eosinophil #: 0.1 x10 3/mm (ref 0.0–0.7)
Eosinophil %: 1.7 %
HCT: 40.2 % (ref 40.0–52.0)
HGB: 13.5 g/dL (ref 13.0–18.0)
Lymphocyte #: 1.5 x10 3/mm (ref 1.0–3.6)
Lymphocyte %: 26.9 %
MCH: 35.6 pg — ABNORMAL HIGH (ref 26.0–34.0)
MCHC: 33.6 g/dL (ref 32.0–36.0)
MCV: 106 fL — AB (ref 80–100)
MONOS PCT: 6.6 %
Monocyte #: 0.4 x10 3/mm (ref 0.2–1.0)
Neutrophil #: 3.4 x10 3/mm (ref 1.4–6.5)
Neutrophil %: 63.7 %
Platelet: 138 x10 3/mm — ABNORMAL LOW (ref 150–440)
RBC: 3.79 10*6/uL — AB (ref 4.40–5.90)
RDW: 15.9 % — ABNORMAL HIGH (ref 11.5–14.5)
WBC: 5.4 x10 3/mm (ref 3.8–10.6)

## 2013-09-01 LAB — HEPATIC FUNCTION PANEL A (ARMC)
ALT: 29 U/L (ref 12–78)
AST: 26 U/L (ref 15–37)
Albumin: 3.7 g/dL (ref 3.4–5.0)
Alkaline Phosphatase: 48 U/L
Bilirubin, Direct: <0.1 mg/dL (ref 0.00–0.20)
Bilirubin,Total: 0.5 mg/dL (ref 0.2–1.0)
Total Protein: 8 g/dL (ref 6.4–8.2)

## 2013-09-01 LAB — CREATININE, SERUM
Creatinine: 0.97 mg/dL (ref 0.60–1.30)
EGFR (African American): >60

## 2013-09-04 ENCOUNTER — Ambulatory Visit: Payer: Self-pay | Admitting: Internal Medicine

## 2013-09-30 LAB — CBC CANCER CENTER
BASOS ABS: 0 x10 3/mm (ref 0.0–0.1)
BASOS PCT: 0.9 %
Eosinophil #: 0.1 x10 3/mm (ref 0.0–0.7)
Eosinophil %: 1.3 %
HCT: 43.3 % (ref 40.0–52.0)
HGB: 14 g/dL (ref 13.0–18.0)
LYMPHS PCT: 27.5 %
Lymphocyte #: 1.3 x10 3/mm (ref 1.0–3.6)
MCH: 34.4 pg — AB (ref 26.0–34.0)
MCHC: 32.4 g/dL (ref 32.0–36.0)
MCV: 106 fL — AB (ref 80–100)
MONOS PCT: 6.5 %
Monocyte #: 0.3 x10 3/mm (ref 0.2–1.0)
NEUTROS PCT: 63.8 %
Neutrophil #: 3.1 x10 3/mm (ref 1.4–6.5)
Platelet: 158 x10 3/mm (ref 150–440)
RBC: 4.08 10*6/uL — ABNORMAL LOW (ref 4.40–5.90)
RDW: 14.6 % — ABNORMAL HIGH (ref 11.5–14.5)
WBC: 4.8 x10 3/mm (ref 3.8–10.6)

## 2013-09-30 LAB — HEPATIC FUNCTION PANEL A (ARMC)
ALK PHOS: 56 U/L
ALT: 238 U/L — AB (ref 12–78)
Albumin: 4.3 g/dL (ref 3.4–5.0)
Bilirubin, Direct: 0.1 mg/dL (ref 0.00–0.20)
Bilirubin,Total: 0.5 mg/dL (ref 0.2–1.0)
SGOT(AST): 108 U/L — ABNORMAL HIGH (ref 15–37)
TOTAL PROTEIN: 8.5 g/dL — AB (ref 6.4–8.2)

## 2013-09-30 LAB — CREATININE, SERUM
CREATININE: 0.96 mg/dL (ref 0.60–1.30)
EGFR (African American): >60

## 2013-09-30 LAB — D-DIMER(ARMC): D-Dimer: 349 ng/ml

## 2013-10-05 ENCOUNTER — Ambulatory Visit: Payer: Self-pay | Admitting: Internal Medicine

## 2013-11-05 ENCOUNTER — Ambulatory Visit: Payer: Self-pay | Admitting: Internal Medicine

## 2013-11-05 LAB — CBC CANCER CENTER
BASOS ABS: 0.1 x10 3/mm (ref 0.0–0.1)
BASOS PCT: 1.2 %
EOS ABS: 0.1 x10 3/mm (ref 0.0–0.7)
EOS PCT: 1.5 %
HCT: 44.9 % (ref 40.0–52.0)
HGB: 14.6 g/dL (ref 13.0–18.0)
LYMPHS ABS: 1.7 x10 3/mm (ref 1.0–3.6)
LYMPHS PCT: 30.4 %
MCH: 33.6 pg (ref 26.0–34.0)
MCHC: 32.6 g/dL (ref 32.0–36.0)
MCV: 103 fL — ABNORMAL HIGH (ref 80–100)
MONOS PCT: 5 %
Monocyte #: 0.3 x10 3/mm (ref 0.2–1.0)
NEUTROS PCT: 61.9 %
Neutrophil #: 3.4 x10 3/mm (ref 1.4–6.5)
Platelet: 144 x10 3/mm — ABNORMAL LOW (ref 150–440)
RBC: 4.36 10*6/uL — AB (ref 4.40–5.90)
RDW: 13.6 % (ref 11.5–14.5)
WBC: 5.4 x10 3/mm (ref 3.8–10.6)

## 2013-11-05 LAB — HEPATIC FUNCTION PANEL A (ARMC)
ALK PHOS: 42 U/L — AB
Albumin: 3.8 g/dL (ref 3.4–5.0)
BILIRUBIN DIRECT: 0.1 mg/dL (ref 0.00–0.20)
Bilirubin,Total: 0.5 mg/dL (ref 0.2–1.0)
SGOT(AST): 16 U/L (ref 15–37)
SGPT (ALT): 27 U/L (ref 12–78)
Total Protein: 7.9 g/dL (ref 6.4–8.2)

## 2013-11-25 LAB — HEPATIC FUNCTION PANEL A (ARMC)
ALBUMIN: 3.6 g/dL (ref 3.4–5.0)
AST: 15 U/L (ref 15–37)
Alkaline Phosphatase: 40 U/L — ABNORMAL LOW
Bilirubin, Direct: <0.05 mg/dL (ref 0.00–0.20)
Bilirubin,Total: 0.3 mg/dL (ref 0.2–1.0)
SGPT (ALT): 19 U/L
Total Protein: 7.6 g/dL (ref 6.4–8.2)

## 2013-11-25 LAB — CBC CANCER CENTER
BASOS ABS: 0 x10 3/mm (ref 0.0–0.1)
Basophil %: 1.1 %
Eosinophil #: 0.1 x10 3/mm (ref 0.0–0.7)
Eosinophil %: 2.2 %
HCT: 46.1 % (ref 40.0–52.0)
HGB: 15 g/dL (ref 13.0–18.0)
Lymphocyte #: 1.5 x10 3/mm (ref 1.0–3.6)
Lymphocyte %: 32.7 %
MCH: 32.7 pg (ref 26.0–34.0)
MCHC: 32.6 g/dL (ref 32.0–36.0)
MCV: 100 fL (ref 80–100)
Monocyte #: 0.3 x10 3/mm (ref 0.2–1.0)
Monocyte %: 6.9 %
NEUTROS ABS: 2.6 x10 3/mm (ref 1.4–6.5)
NEUTROS PCT: 57.1 %
Platelet: 120 x10 3/mm — ABNORMAL LOW (ref 150–440)
RBC: 4.6 10*6/uL (ref 4.40–5.90)
RDW: 13.7 % (ref 11.5–14.5)
WBC: 4.5 x10 3/mm (ref 3.8–10.6)

## 2013-11-25 LAB — CREATININE, SERUM
CREATININE: 0.99 mg/dL (ref 0.60–1.30)
EGFR (African American): >60
EGFR (Non-African Amer.): >60

## 2013-12-05 ENCOUNTER — Ambulatory Visit: Payer: Self-pay | Admitting: Internal Medicine

## 2013-12-23 LAB — CBC CANCER CENTER
Basophil #: 0 x10 3/mm (ref 0.0–0.1)
Basophil %: 0.7 %
Eosinophil #: 0.1 x10 3/mm (ref 0.0–0.7)
Eosinophil %: 1.9 %
HCT: 44.9 % (ref 40.0–52.0)
HGB: 14.7 g/dL (ref 13.0–18.0)
LYMPHS ABS: 1.7 x10 3/mm (ref 1.0–3.6)
LYMPHS PCT: 37.4 %
MCH: 32.8 pg (ref 26.0–34.0)
MCHC: 32.8 g/dL (ref 32.0–36.0)
MCV: 100 fL (ref 80–100)
Monocyte #: 0.3 x10 3/mm (ref 0.2–1.0)
Monocyte %: 7.3 %
Neutrophil #: 2.4 x10 3/mm (ref 1.4–6.5)
Neutrophil %: 52.7 %
PLATELETS: 136 x10 3/mm — AB (ref 150–440)
RBC: 4.49 10*6/uL (ref 4.40–5.90)
RDW: 14 % (ref 11.5–14.5)
WBC: 4.5 x10 3/mm (ref 3.8–10.6)

## 2013-12-23 LAB — HEPATIC FUNCTION PANEL A (ARMC)
ALT: 32 U/L
Albumin: 3.7 g/dL (ref 3.4–5.0)
Alkaline Phosphatase: 48 U/L
BILIRUBIN TOTAL: 0.4 mg/dL (ref 0.2–1.0)
Bilirubin, Direct: 0.1 mg/dL (ref 0.00–0.20)
SGOT(AST): 42 U/L — ABNORMAL HIGH (ref 15–37)
TOTAL PROTEIN: 7.6 g/dL (ref 6.4–8.2)

## 2013-12-23 LAB — CREATININE, SERUM
CREATININE: 1.19 mg/dL (ref 0.60–1.30)
EGFR (African American): >60
EGFR (Non-African Amer.): >60

## 2013-12-23 LAB — HEPARIN LEVEL (UNFRACTIONATED): Anti-Xa(Unfractionated): 0.73 IU/mL — ABNORMAL HIGH (ref 0.30–0.70)

## 2014-01-05 ENCOUNTER — Ambulatory Visit: Payer: Self-pay | Admitting: Internal Medicine

## 2014-02-03 LAB — CBC CANCER CENTER
Basophil #: 0 x10 3/mm (ref 0.0–0.1)
Basophil %: 0.8 %
EOS PCT: 1.8 %
Eosinophil #: 0.1 x10 3/mm (ref 0.0–0.7)
HCT: 43 % (ref 40.0–52.0)
HGB: 13.9 g/dL (ref 13.0–18.0)
Lymphocyte #: 1.5 x10 3/mm (ref 1.0–3.6)
Lymphocyte %: 31.8 %
MCH: 32.3 pg (ref 26.0–34.0)
MCHC: 32.3 g/dL (ref 32.0–36.0)
MCV: 100 fL (ref 80–100)
Monocyte #: 0.4 x10 3/mm (ref 0.2–1.0)
Monocyte %: 8.4 %
NEUTROS PCT: 57.2 %
Neutrophil #: 2.7 x10 3/mm (ref 1.4–6.5)
Platelet: 115 x10 3/mm — ABNORMAL LOW (ref 150–440)
RBC: 4.3 10*6/uL — AB (ref 4.40–5.90)
RDW: 14.6 % — AB (ref 11.5–14.5)
WBC: 4.7 x10 3/mm (ref 3.8–10.6)

## 2014-02-03 LAB — CREATININE, SERUM
Creatinine: 0.96 mg/dL (ref 0.60–1.30)
EGFR (Non-African Amer.): >60

## 2014-02-03 LAB — HEPATIC FUNCTION PANEL A (ARMC)
ALBUMIN: 3.9 g/dL (ref 3.4–5.0)
AST: 54 U/L — AB (ref 15–37)
Alkaline Phosphatase: 52 U/L
BILIRUBIN TOTAL: 1.3 mg/dL — AB (ref 0.2–1.0)
Bilirubin, Direct: 0.3 mg/dL — ABNORMAL HIGH (ref 0.00–0.20)
SGPT (ALT): 130 U/L — ABNORMAL HIGH
TOTAL PROTEIN: 7.2 g/dL (ref 6.4–8.2)

## 2014-02-03 LAB — HEPARIN LEVEL (UNFRACTIONATED): ANTI-XA(UNFRACTIONATED): 2.94 [IU]/mL — AB (ref 0.30–0.70)

## 2014-02-04 ENCOUNTER — Ambulatory Visit: Payer: Self-pay | Admitting: Internal Medicine

## 2014-03-16 ENCOUNTER — Ambulatory Visit: Payer: Self-pay | Admitting: Internal Medicine

## 2014-03-16 LAB — CBC CANCER CENTER
BASOS ABS: 0 x10 3/mm (ref 0.0–0.1)
BASOS PCT: 0.1 %
Eosinophil #: 0 x10 3/mm (ref 0.0–0.7)
Eosinophil %: 0.1 %
HCT: 45.5 % (ref 40.0–52.0)
HGB: 15.2 g/dL (ref 13.0–18.0)
Lymphocyte #: 0.4 x10 3/mm — ABNORMAL LOW (ref 1.0–3.6)
Lymphocyte %: 7.3 %
MCH: 34.3 pg — AB (ref 26.0–34.0)
MCHC: 33.5 g/dL (ref 32.0–36.0)
MCV: 103 fL — AB (ref 80–100)
MONOS PCT: 3.1 %
Monocyte #: 0.2 x10 3/mm (ref 0.2–1.0)
NEUTROS PCT: 89.4 %
Neutrophil #: 5.2 x10 3/mm (ref 1.4–6.5)
Platelet: 110 x10 3/mm — ABNORMAL LOW (ref 150–440)
RBC: 4.43 10*6/uL (ref 4.40–5.90)
RDW: 14.3 % (ref 11.5–14.5)
WBC: 5.8 x10 3/mm (ref 3.8–10.6)

## 2014-03-16 LAB — HEPATIC FUNCTION PANEL A (ARMC)
ALBUMIN: 3.7 g/dL (ref 3.4–5.0)
AST: 19 U/L (ref 15–37)
Alkaline Phosphatase: 53 U/L
Bilirubin, Direct: <0.1 mg/dL (ref 0.0–0.2)
Bilirubin,Total: 0.4 mg/dL (ref 0.2–1.0)
SGPT (ALT): 32 U/L
Total Protein: 7.9 g/dL (ref 6.4–8.2)

## 2014-03-16 LAB — CREATININE, SERUM: CREATININE: 1.06 mg/dL (ref 0.60–1.30)

## 2014-03-16 LAB — HEPARIN LEVEL (UNFRACTIONATED): Anti-Xa(Unfractionated): >1.1 IU/mL (ref 0.30–0.70)

## 2014-04-06 ENCOUNTER — Ambulatory Visit: Payer: Self-pay | Admitting: Internal Medicine

## 2014-06-09 ENCOUNTER — Ambulatory Visit: Payer: Self-pay | Admitting: Internal Medicine

## 2014-06-09 LAB — CBC CANCER CENTER
Basophil #: 0 "x10 3/mm "
Basophil %: 0.7 %
Eosinophil #: 0 "x10 3/mm "
Eosinophil %: 0.8 %
HCT: 46.3 %
HGB: 15.4 g/dL
Lymphocyte %: 24.9 %
Lymphs Abs: 1.3 "x10 3/mm "
MCH: 33.4 pg
MCHC: 33.3 g/dL
MCV: 100 fL
Monocyte #: 0.3 "x10 3/mm "
Monocyte %: 5 %
Neutrophil #: 3.7 "x10 3/mm "
Neutrophil %: 68.6 %
Platelet: 125 "x10 3/mm " — ABNORMAL LOW
RBC: 4.62 "x10 6/mm "
RDW: 13.5 %
WBC: 5.4 "x10 3/mm "

## 2014-06-09 LAB — HEPATIC FUNCTION PANEL A (ARMC)
ALBUMIN: 3.8 g/dL (ref 3.4–5.0)
ALK PHOS: 59 U/L (ref 46–116)
ALT: 98 U/L — AB (ref 14–63)
Bilirubin, Direct: 0.2 mg/dL (ref 0.0–0.2)
Bilirubin,Total: 0.8 mg/dL (ref 0.2–1.0)
SGOT(AST): 53 U/L — ABNORMAL HIGH (ref 15–37)
TOTAL PROTEIN: 7.9 g/dL (ref 6.4–8.2)

## 2014-06-09 LAB — CREATININE, SERUM
Creatinine: 1.11 mg/dL (ref 0.60–1.30)
EGFR (African American): >60

## 2014-06-09 LAB — HEPARIN LEVEL (UNFRACTIONATED): Anti-Xa(Unfractionated): >3.6 [IU]/mL — ABNORMAL HIGH

## 2014-07-06 ENCOUNTER — Ambulatory Visit
Admit: 2014-07-06 | Disposition: A | Discharge status: Home / Self Care | Payer: Self-pay | Attending: Internal Medicine | Admitting: Internal Medicine

## 2014-08-24 NOTE — Consult Note (Signed)
PATIENT NAME:  [name], [name] MR#:  [id number] DATE OF BIRTH:  22-Apr-1961  DATE OF CONSULTATION:  10/28/2011  REFERRING PHYSICIAN:   CONSULTING PHYSICIAN:  Annice NeedyJason S. Delores Edelstein, MD  REASON FOR CONSULTATION: IVC and iliac vein thrombosis.   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 This has gotten a little better with heparin, but is still moderate with elevation in heparinization. He does not have skin loss. He does not have signs of phlegmasia. The pain is also improved from what it was on admission. He has thrombocytopenia from alcohol abuse apparently and this may have been one of the reasons his filter was placed initially. He reports no complications of bleeding while on coagulation at this time. We are asked to evaluate for further needs of further therapy.  PAST MEDICAL HISTORY:  1. 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  2. Factor II mutation.  3. Right upper lung mass which apparently was negative for malignancy.   PAST SURGICAL HISTORY:  1. Wedge resection of lung mass.  2. Appendectomy.  3. Tonsillectomy.  4. Skin graft.  5. Neck fusion surgery.   FAMILY HISTORY: Apparently there is no family history of thrombotic issues, but his father's side of the family has a strong history of coronary artery disease.  SOCIAL HISTORY: The patient smokes at least 1 pack per day. He drinks regularly, both liquor and beer. He is currently unemployed.  MEDICATIONS: Aspirin 325 mg daily.   ALLERGIES: No known drug allergies.  REVIEW OF SYSTEMS: GENERAL: No fevers or chills. No intentional weight loss or gain. EYES: No blurry or double vision. EARS: No tinnitus or ear pain. CARDIOVASCULAR: No palpitations or chest pain. RESPIRATORY: No shortness breath or cough. GI: No nausea, vomiting, or diarrhea. Positive for abdominal pain and back pain. GENITOURINARY: Some  pain with urination. No hematuria. ENDOCRINE: No heat or cold intolerance. PSYCH: History of alcohol abuse. NEUROLOGIC: No transient ischemic attack, stroke, or seizure.  HEME: Positive for hypercoagulable state and thrombocytopenia.   PHYSICAL EXAMINATION:   GENERAL: This is a well-developed, well-nourished white male who appears a little older than his stated age.   VITAL SIGNS: Temperature 97.9, pulse 78, blood pressure 120/82, and saturations are 100% on room air.   HEAD/NECK: Normocephalic, atraumatic.   EYES: Sclerae anicteric. Conjunctivae are clear.   EARS: Normal external appearance. Hearing is intact.   NECK: Supple without adenopathy or jugular venous distention.   HEART: Regular rate and rhythm without murmurs.   LUNGS: Clear to auscultation bilaterally.   ABDOMEN: Soft and nondistended. He really does not have any significant rebound guarding or tenderness at this time.   EXTREMITIES: He has moderate lower extremity edema, a little worse on the right and left. Stasis dermatitis changes are present as are significant varicosities. He does not have open ulceration, he has good capillary refill and no evidence of phlegmasia.   NEUROLOGIC: Normal strength and tone in all four extremities.   PSYCH: Normal affect and mood.   LABS/RADIOLOGIC STUDIES: White blood cell count 5.8, hemoglobin 13.2, and platelet count 106,000.   INR today is 1.0.  Ultrasound suggests IVC and iliac vein thrombosis.   ASSESSMENT AND PLAN: This is a 54 year old white male with a history of hypercoagulable state and previous IVC filter. 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 He is not a great  candidate for thrombolysis due to his thrombocytopenia and alcohol abuse. He has improved some with heparinization elevation. I would continue heparinization or Lovenox transitioning him to Coumadin. Xarelto would also be an option if compliance is an issue, or liver disease as an issue due to his  alcohol use.  I would recommend strongly that he wear at least moderate strength compression stockings, 30 to 40 mmHg, and will likely need to do this for the rest of his life. I will be happy to continue to follow along. If his symptoms do not improve with anticoagulation further, we could consider thrombolysis and thrombectomy, although I do think this is a bit of risky procedure in somebody who is reasonably stable otherwise. We will also be happy to see him as an outpatient.   This is a level-4 consultation. ____________________________ Annice Needy, MD jsd:slb D: 11/26/2011 16:42:00 ET T: 11/27/2011 09:53:31 ET JOB#: [id number]  cc: Annice Needy, MD, <Dictator> Annice Needy MD ELECTRONICALLY SIGNED 11/28/2011 11:57

## 2014-08-27 NOTE — H&P (Signed)
PATIENT NAME:  [name], [name] MR#:  [id number] DATE OF BIRTH:  [birth year]  DATE OF ADMISSION:  09/19/2012  PRIMARY CARE PHYSICIAN: None local.  ONCOLOGIST: Maren ReamerSandeep R. Sherrlyn HockPandit, MD  CHIEF COMPLAINT: Redness and swelling of the right leg.   HISTORY OF PRESENT ILLNESS: The patient is a 54 year old male with history of recurrent DVTs and also  history of heterozygous factor II mutation, on chronic Lovenox, came in because of right leg swelling and pain and redness. The patient started to have right knee pain after he was doing some yard work 2 weeks ago. He scraped his knee and then he started to have some pain, and at that time he cleaned the wound and applied bacitracin ointment. This Monday, the patient's lawnmower was not working so he was trying to fix it. After that, he noticed the scrape on the right knee was a little bit worse, so again he cleaned it and applied Neosporin ointment, but since yesterday the swelling and redness has gotten worse and today morning, he noticed that the redness around the right knee actually spread over the right leg and the surrounding area  with increased swelling and also pain when he ambulates, so he came to the Emergency Room for further evaluation. The patient denies any shortness of breath and denies any other complaints.   PAST MEDICAL HISTORY: Significant for history of multiple DVTs and had  IVC filter placed, and the patient sees Dr. Sherrlyn HockPandit on a regular basis. The patient is known to have factor II mutation. The patient has a history of right lung mass which was PET positive and had a CT-guided biopsy for that, and it showed that it was not malignancy. The patient had a wedge resection in 2010 for the lung lesion. Also, the patient has a history of tobacco abuse. The patient was self-taken off  Coumadin in 2012. The patient was seen by Dr. Sherrlyn HockPandit on April 28, and the patient right now is only on Lovenox for his DVT clots as  per Dr. Sherrlyn HockPandit. The patient has stopped  taking Coumadin since July 2012  and was admitted again in June 2013 for recurrent DVTs. The patient was admitted in January for bilateral leg cellulitis and at that time, venous Dopplers of the leg showed DVT and at that time, anticoagulation was changed to Lovenox indefinitely.   ALLERGIES: No known allergies.   SOCIAL HISTORY: Smokes 1/2 pack per day, plans to quit but the patient has other family members who are living with them who smoke, so it is hard for him to quit. Occasional drinking. No drugs.   PAST SURGICAL HISTORY: The patient had a skin graft to the left leg and left ankle neck fusion surgery, wedge resection, appendectomy, tonsillectomy.   FAMILY HISTORY: No DVTs in the family, but has coronary artery disease.   MEDICATIONS: Aspirin 325 mg daily, Lovenox 80 mg every 12 hours.   REVIEW OF SYSTEMS: CONSTITUTIONAL: The patient has no fever, no fatigue.  EYES: No blurred vision.  EARS, NOSE, THROAT: No tinnitus. No epistaxis. No difficulty swallowing.  RESPIRATORY: No cough. No wheezing.  CARDIOVASCULAR: No chest pain. No orthopnea.  GASTROINTESTINAL: No nausea. No vomiting. No diarrhea.  GENITOURINARY: No dysuria.  ENDOCRINE: No polyuria or nocturia.  HEMATOLOGIC: No anemia. The patient does have factor II mutation with hypercoagulable state.  MUSCULOSKELETAL: The patient has right knee pain and right leg pain at this time.  NEUROLOGIC: No weakness. No TIAs.  PSYCHIATRIC: No anxiety or insomnia.  PHYSICAL EXAMINATION: VITAL SIGNS: Temperature 97.6, heart rate 94, blood pressure 122/83, sats 97% on  room air.  GENERAL: The patient is alert, awake  oriented male patient, answering questions appropriately, not in distress.  HEENT: Head atraumatic, normocephalic. Pupils equally reacting to light. Extraocular movements are intact.  EARS, NOSE, THROAT: No tympanic membrane congestion. No turbinate hypertrophy. No oropharyngeal erythema.  NECK: Normal range of motion. No JVD. No  carotid bruit.  CARDIOVASCULAR: S1, S2 regular. PMI not displaced. Pulses are equal.  ABDOMEN: Soft, nontender, nondistended. Bowel sounds present. No hernias. EXTREMITIES: Skin on the right leg is very red and tender and swollen. The swelling is starting above knee towards the proximal one third of the lower leg and does have pitting edema and tenderness to palpation all over the right leg from knee to about 2 inches above the ankle. The patient has no redness in the left leg.  NEUROLOGIC: Alert, awake, oriented. Cranial nerves II through XII intact. Power 5/5 in upper and lower extremities. Sensation is intact. DTRs 2+ bilaterally.  PSYCHIATRIC: Mood and affect are within normal limits.   LABORATORY AND RADIOLOGICAL DATA: INR 0.9. Electrolytes: Sodium 133, potassium 4.2, chloride 101, bicarbonate 28, BUN 13, creatinine 1.13, glucose 113. WBC 6.8, hemoglobin 13.3, hematocrit 39.5, platelets 131. Ultrasound of the leg showed bilateral DVTs, which appear to be nonocclusive, involving common femoral, poplieal and popliteal veins in the right and left and enlarged lymph node present in the right groin.   ASSESSMENT AND PLAN: The patient is a 54 year old male patient with recurrent deep venous thromboses, on Lovenox indefinitely, came in with right leg swelling, pain and redness. The patient has right leg cellulitis due to a recent injury while he was  working in the yard, so we will admit him to observation status. 1.  Right leg cellulitis. The patient will be started on vancomycin to cover for possible methicillin-resistant Staphylococcus aureus. Follow the white count, follow the clinical course, and possibly we can discharge him tomorrow if he feels better with oral Bactrim.  2.  Bilateral deep venous thromboses, history of factor II mutation. The patient is on Lovenox and aspirin. Continue those medications.  3.  Tobacco abuse. Counseled against smoking. Offered assistance and the patient wants to quit,  but because of his relatives who smoke in the house, it is difficult for him. We are going to start the nicotine patch 14 mg every 24 hours.   TIME SPENT ON HISTORY AND PHYSICAL: 55 minutes. This is an observation H and P.   ____________________________ Katha Hamming, MD sk:jm D: 09/19/2012 16:56:13 ET T: 09/19/2012 18:10:48 ET JOB#: [id number]  cc: Katha Hamming, MD, <Dictator> Katha Hamming MD ELECTRONICALLY SIGNED 09/19/2012 18:49

## 2014-08-27 NOTE — Discharge Summary (Signed)
PATIENT NAME:  [name], Antonie E MR#:  [id number] DATE OF BIRTH:  1960/10/14  DATE OF ADMISSION:  06/07/2012 DATE OF DISCHARGE:  06/09/2012  CONSULTANT: Janese BanksSandeep Pandit, MD  PRIMARY ONCOLOGIST: Janese BanksSandeep Pandit, MD  CHIEF COMPLAINT: Leg swelling, tenderness and erythema.   DISCHARGE DIAGNOSES: 1. Lower extremity cellulitis.  2. Subacute to acute deep venous thrombosis of the lower extremities with history of deep vein thrombosis in the past.  3. History of multiple deep venous thromboses, inferior vena cava thrombosis, celiac vein thrombosis and on anticoagulation in the past status post inferior vena cava filter with known history of factor II mutation.  4. History of right lung mass in October 2010, which was biopsy-negative for malignancy, per chart, and history of wedge resection. 5. Tobacco abuse.   DISCHARGE MEDICATIONS:  1. Aspirin 325 mg once a day. 2. Cephalexin 500 mg 2 times a day. 3. Enoxaparin 85 mg sub-Q every 12 hours.  NOTE: Please stop taking Coumadin.  DISPOSITION: The patient will be getting discharged to home.   DISCHARGE DIET: Low sodium.   DISCHARGE ACTIVITY: As tolerated.   DISCHARGE FOLLOWUP: Please follow with Dr. Sherrlyn HockPandit within 1 to 2 days and please follow with your primary care physician for your cellulitis in 1 to 2 weeks.   HISTORY OF PRESENT ILLNESS AND HOSPITAL COURSE: For full details of the H and P, please see the dictation on 06/07/2012 by Dr. Randol KernElgergawy, but briefly this is a pleasant 54 year old Caucasian male with history of multiple DVTs in the past, history of factor II mutation and sees Dr. Sherrlyn HockPandit, on chronic anticoagulation with Coumadin, who presented with the above chief complaint. The INR was therapeutic at that time. The patient had gone to Dr. Darden DatesPandit's office to check his INR level, which was 2.3, but had some worsening lower extremity swelling, edema, erythema and tenderness and the nurse sent him to the ED where he was found with Doppler  showing evidence for multiple DVTs, bilaterally, nonocclusive. He also had some evidence for cellulitis and therefore he was admitted to the hospitalist service for further evaluation and management. Initial INR here in the hospital was 2.4 and by discharge it was 2.1, of note. He had normal hepatic function and BNP panel. His WBC was 5.1. The ultrasound, which was done on the 31st, had picked up bilateral nonocclusive DVTs. Dr. Sherrlyn HockPandit was consulted. The case was discussed with the radiologist who believed that DVTs were more subacute to acute and not chronic in nature. Given that he was on Coumadin and likely failed Coumadin therapy, he was switched over to Lovenox. He was to follow with Dr. Sherrlyn HockPandit as an outpatient for further care.   In regards to his lower extremity cellulitis, he did not have a fever or leukocytosis. He was started on Zosyn. His redness and swelling did subside. His antibiotic was changed to Keflex and he was discharged for 7 days of Keflex.   TIME SPENT: 35 minutes.  ____________________________ Krystal EatonShayiq Sharday Michl, MD sa:sb D: 06/10/2012 13:25:34 ET T: 06/10/2012 13:39:42 ET JOB#: [id number]  cc: Krystal EatonShayiq Aide Wojnar, MD, <Dictator> Sandeep R. Sherrlyn HockPandit, MD Krystal EatonSHAYIQ Crystalynn Mcinerney MD ELECTRONICALLY SIGNED 06/12/2012 81:1920:08

## 2014-08-27 NOTE — Discharge Summary (Signed)
PATIENT NAME:  [name], [name] MR#:  [id number] DATE OF BIRTH:  [birth year]  DATE OF ADMISSION:  09/19/2012 DATE OF DISCHARGE:  09/21/2012  PRESENTING COMPLAINT: Swelling and redness of the right leg.   DISCHARGE DIAGNOSES: 1.  Right lower extremity cellulitis suspected from laceration and superficial injury from the right knee.  2.  Heterozygous factor II mutation, on lifelong anticoagulation with Lovenox.  3.  Chronic deep venous thrombosis.  4.  History of pulmonary embolus.   DISCHARGE MEDICATIONS: 1.  Aspirin 325 mg p.o. daily.  2.  Enoxaparin. The patient takes 70 mg b.i.d., sub-Q.  3.  Hydrocodone/acetaminophen 5/325 mg 1 tablet every 8 hours as needed.  4.  Keflex 500 mg every 8 hours.  DISCHARGE FOLLOWUP:   1.  With your primary care physician in 1 to 2 weeks.  2.  With Dr. Sherrlyn HockPandit on your scheduled appointment.  LABORATORY AND DIAGNOSTICS:  White count 5.4, H and H 12.3 and 35.9, and platelet count is 121.   Blood cultures negative in 5 days.   Ultrasound of bilateral lower extremities shows DVT which appears nonocclusive involving the common femoral vein, femoral vein, and popliteal vein on the right, and left popliteal vein.   Comprehensive metabolic panel within normal limits, other than serum albumin of 3.2.   BRIEF SUMMARY OF HOSPITAL COURSE:   1.  [name] is a 54 year old Caucasian gentleman with history of heterozygous factor II who has history of recurrent DVT, on Lovenox for lifeline anticoagulation therapy, who comes in with right leg swelling, pain, and redness. The patient was admitted with right leg cellulitis. The patient initially was started on IV antibiotics with IV vancomycin. The patient did do some yard work and scraped his right knee, which there was a small laceration, which probably was the source of his infection. He remained afebrile. White count remained stable. After a couple of doses of IV antibiotics, the patient's redness started to improve, his  blood cultures remained negative, and he remained afebrile.  Hence, antibiotics was changed to p.o. Keflex, which he will take for 10 days.  2.  Bilateral DVT, lower extremity, with history of heterozygote factor II mutation. The patient is on lifelong Lovenox.  3.  Tobacco abuse. Counseled against smoking.   TIME SPENT: 40 minutes. ____________________________ Wylie HailSona A. Allena KatzPatel, MD sap:sb D: 09/25/2012 13:43:08 ET T: 09/25/2012 14:41:50 ET JOB#: [id number]  cc: Zephaniah Enyeart A. Allena KatzPatel, MD, <Dictator> Sandeep R. Sherrlyn HockPandit, MD Willow OraSONA A Isamar Wellbrock MD ELECTRONICALLY SIGNED 10/02/2012 14:49

## 2014-08-27 NOTE — Consult Note (Signed)
DATE OF BIRTH:  1960-09-13  DATE OF CONSULTATION:  06/08/2012  REFERRING PHYSICIAN:  Dr. Randol Kern CONSULTING PHYSICIAN:  Roberto Carswell R. Sherrlyn Hock, MD  REASON FOR CONSULTATION:  Known history of lower extremity DVT on Coumadin, factor II mutation history, presents with cellulitis.   HISTORY OF PRESENT ILLNESS:  The patient is a 54 year old gentleman with a past medical history significant for recurrent lower extremity deep venous thrombosis (initial episode left lower extremity DVT about 20+ years ago, right lower extremity DVT more than 8 years ago, bilateral lower extremity DVT October 2010) with heterozygous factor II mutation, otherwise hypercoagulable workup negative, on longterm anticoagulation with Coumadin, appendectomy, tonsillectomy, neck fusion surgery, skin graft left ankle, right upper lung mass October 2010 PET positive, which was resected, but found negative for malignancy. The patient has had Coumadin monitoring with INR in the therapeutic range. He was admitted to the hospital on February 1st with complaints of bilateral lower extremity progressive swelling and discomfort, he has been found to have cellulitis and is on IV Zosyn. Clinically improving. He also had venous Doppler done, which continues to show bilateral nonocclusive deep venous thrombosis of lower extremities, right SFV and popliteal vein and left distal SFV and popliteal vein. The patient denies any major pain issues. He denies any recurrent falls or trauma. Also, additional past history includes, in June 2013, the patient had been diagnosed with recurrent DVT, IVC and bilateral iliac vein, and had IVC filter placed also, and was placed back on anticoagulation since he had stopped Coumadin prior to this on his own. Denies any bleeding symptoms. Denies fever or chills. Eating steady.   PAST MEDICAL HISTORY AND SURGICAL HISTORY:  As in HPI above.   FAMILY HISTORY:  Father died at age 52 from MI, number of siblings with coronary  artery disease. Denies other thromboembolic disorders.   SOCIAL HISTORY:  Chronic smoker 1/2 pack per day for many years. He drinks mostly beer every day, states that he has significantly cut down. Denies recreational drug usage. Physically remains active as much as possible.   ALLERGIES:  No known drug allergies.   MEDICATIONS:  Aspirin 325 mg daily; Coumadin 10 mg on 4 days of the week, 5 mg on Monday, Wednesday, Friday.   REVIEW OF SYSTEMS:  CONSTITUTIONAL:  As in HPI. No fever or chills. No night sweats.  HEENT:  Denies headaches, dizziness, epistaxis, ear or jaw pain.  CARDIAC:  No angina at rest, palpitations, orthopnea or PND.  LUNGS:  No new dyspnea, cough, chest pain or hemoptysis.  GASTROINTESTINAL:  No nausea, vomiting, diarrhea. No bright red blood in stools or melena.  GENITOURINARY:  No dysuria or hematuria.  SKIN:  Lower extremity redness and pain as described above. No other rashes or pruritus.  HEMATOLOGIC:  No bleeding symptoms.  NEUROLOGIC:  No new focal weakness, seizures or loss of consciousness.  EXTREMITIES:  As in HPI.  ENDOCRINE:  No polyuria or polydipsia. Appetite steady.   PHYSICAL EXAMINATION:  GENERAL:  The patient is a moderately built and nourished individual, alert and oriented, and converses appropriately. No acute distress. No icterus. No pallor.  VITAL SIGNS:  98, 60, 17, 115/76, 100% on room air.  HEENT:  Normocephalic, atraumatic. Extraocular movements intact. Sclerae anicteric. No oral thrush.  NECK:  Negative for lymphadenopathy.  CARDIOVASCULAR:  S1, S2, regular rate and rhythm.  LUNGS:  Bilateral good air entry. No crepitations or rhonchi noted.  ABDOMEN:  Soft, nontender. No hepatosplenomegaly clinically.  EXTREMITIES:  Bilateral 1 to  2+ pitting edema, mild redness of skin over both legs. No open sores or drainage of pus.  SKIN:  No other rashes or major bruising.  LYMPHATICS:  No adenopathy in axillary or inguinal areas either.  NEUROLOGIC:   Nonfocal, cranial nerves intact.   LABORATORY RESULTS:  INR today is 2.5. CBC from January 31 showed WBC 5100, hemoglobin 12.7, platelets 133, MCV 109. Creatinine 0.91, potassium 4.5, calcium 8.9. Liver functions unremarkable. Venous Doppler study as described above.   IMPRESSION AND RECOMMENDATIONS:  A 54 year old gentleman with a known history of multiple episodes of lower extremity deep vein thrombosis as described above, heterozygous factor II mutation, otherwise negative hypercoagulable workup, on chronic anticoagulation with Coumadin. He presents with a therapeutic range INR and with bilateral lower extremity cellulitis with symptoms improving on IV Zosyn, recommend continuing on this. Venous Doppler continues to show evidence of nonocclusive thrombus in both lower extremities. I am unclear if this is residual old thrombus versus recurrent new DVTs. The plan is to discuss with radiology tomorrow regarding this issue. If he does have evidence of new/recurrent deep vein thrombosis  on therapeutic INR, then may need to consider changing anticoagulation to Lovenox and monitor. Otherwise, would continue on Coumadin for now. Will continue to follow. If the patient is discharged soon, he was advised [facility]. The patient is agreeable to this plan.   Thank you for the referral. Please feel free to contact me if you have additional questions.     ____________________________ Roberto ReamerSandeep R. Sherrlyn HockPandit, MD srp:ms D: 06/08/2012 17:32:37 ET T: 06/08/2012 18:42:15 ET JOB#: [id number]  cc: Roberto Say R. Sherrlyn HockPandit, MD, <Dictator> Wille CelesteSANDEEP R Jaecion Dempster MD ELECTRONICALLY SIGNED 06/09/2012 10:37

## 2014-08-27 NOTE — H&P (Signed)
PATIENT NAME:  [name], [name]:  [id number] DATE OF BIRTH:  Apr 20, 1961  DATE OF ADMISSION:  06/07/2012  REFERRING PHYSICIAN:  Dr. Bayard Malesandolph Brown.   PRIMARY CARE PHYSICIAN: , following with Dr. Sherrlyn HockPandit regularly. but can't recall PCP.  CHIEF COMPLAINT:  Leg swelling, tenderness and erythema.   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 Darden DatesPandit's office to check his INR level which was 2.3, but patient reported to staff there he had worsening lower extremity swelling, edema, erythema and tenderness where they send him to ED, in ED patient had venous Doppler done which did show evidence of multiple DVTs which were nonocclusive, it is unclear if these are new or old blood clots, as there is no previous venous Dopplers on the record as last thing was abdominal ultrasound which did show his IVC and celiac vein thrombosis, the patient was noticed to have cellulitis lesions by ED physician, where he was started on clindamycin.  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   PAST MEDICAL HISTORY:   1.  History of multiple DVTs, IVC thrombosis, celiac vein thrombosis, on anticoagulation, status post IVC filter followed by Dr. Sherrlyn HockPandit with known history of factor II mutation.   2.   History of right lung mass in October 2010, which was positive with CT-guided biopsy negative for malignancy.   3.  Wedge resection in 2010 which was negative for malignancy.  4.  Tobacco abuse.   PAST SURGICAL HISTORY:   1.  CT-guided lung biopsy.  2.  Wedge resection of lung mass.  3.  Appendectomy.  4.  Tonsillectomy.  5.  Skin graft of left leg and left ankle.  6.  Neck fusion surgery.   FAMILY HISTORY:  No family history of DVT or PE.  Family history is significant for coronary artery disease.   SOCIAL HISTORY:  He reports he cut back.  He smokes 5 to 7 cigarettes per day.  Drinks alcohol 1 to 2 times every week.  Denies any history of drug abuse.   HOME MEDICATIONS: 1.  Aspirin 325 mg daily.  2.  Warfarin 10 mg every Tuesday, Thursday, Saturday and Sunday and 5 mg every Monday, Wednesday, Friday.   ALLERGIES:  No known drug allergies.   REVIEW OF SYSTEMS:  CONSTITUTIONAL:  The patient denies any fever, chills, fatigue, weakness.  EYES:  Denies blurry vision, double vision, pain, inflammation, glaucoma.  EARS, NOSE, THROAT:  Denies tinnitus, ear pain, hearing loss, epistaxis or discharge.  RESPIRATORY:  Denies cough, wheezing, hemoptysis, painful respiratory, dyspnea.  CARDIOVASCULAR:  Denies chest pain, orthopnea, arrhythmia, palpitations, syncope.  Has worsening lower extremity edema.  GASTROINTESTINAL:  Denies nausea, vomiting, diarrhea, abdominal pain, hematemesis, melena, rectal bleed, jaundice, constipation.  GENITOURINARY:  Denies dysuria, hematuria, renal colic.  ENDOCRINE:  Denies polyuria  or polydipsia, heat or cold intolerance.  HEMATOLOGY:  Denies anemia, easy bruising.  Has history of hypercoagulable state with multiple DVTs in the past.  INTEGUMENTARY:  Denies any acne.  Has lower extremity rash, swelling and has lower extremity swelling, erythema, tenderness.  MUSCULOSKELETAL:  Denies neck pain, shoulder pain, knee pain, cramps, gout.  NEUROLOGICAL:  Denies  numbness, weakness, dysarthria, epilepsy, ataxia, CVA, TIA, seizures.  PSYCHIATRIC:  Denies anxiety, insomnia, schizophrenia, substance or alcohol abuse.   PHYSICAL EXAMINATION: VITAL SIGNS:  Temperature 98.2, pulse 82, respiratory rate 18, blood pressure 107/67, saturating 97% on room air.  GENERAL:  Well-nourished male, looks comfortable in bed in no apparent distress.  HEENT:  Head atraumatic, normocephalic.  Pupils equal, reactive to light.  Pink conjunctivae.  Anicteric sclerae.  Moist oral mucosa.  NECK:  Supple.  No thyromegaly.  No JVD.  CHEST:  Good air entry bilaterally.  No wheezing, rales, rhonchi.  CARDIOVASCULAR:  S1, S2 heard.  No rubs, murmur, gallops.  ABDOMEN:  Soft, nontender, nondistended.  Bowel sounds present.  EXTREMITIES:  Has bilateral lower extremity swelling +3 with erythema up to the below-knee and bilateral lower extremity with some chronic systemic changes and then the right side, it does extend into the medial surface to mid-thigh area.  PSYCHIATRIC:  Appropriate affect.  Awake, alert x 3.  Intact judgment and insight.  NEUROLOGIC:  Cranial nerves grossly intact.  Motor V/V.  No focal deficits.   PERTINENT LABORATORY DATA:  Glucose 88, BUN 9, creatinine 0.91, sodium 138, potassium 4.5, chloride 106, CO2 25, white blood cells 5.1, hemoglobin 12.7, hematocrit 38.9, platelets 133, INR 2.5.  Ultrasound of lower extremity bilateral showing bilateral nonocclusive DVTs of the lower extremities.   ASSESSMENT AND PLAN: 1.  Lower extremity cellulitis, the patient has lots of extremity erythema, tenderness and swelling, with recent abrasions of the skin, most likely related to cellulitis, the patient will be started on Zosyn, when the erythema improves he can switch to by mouth antibiotics.  2.  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 Sherrlyn Hock who is very familiar with the patient consulted for further recommendations.  2.  Tobacco abuse.  The patient was counseled, will be started on nicotine patch.  3.  Deep vein thrombosis prophylaxis.  The patient is on treatment of warfarin.  4.  GI prophylaxis.  The patient is on Protonix.   CODE STATUS:  FULL CODE.   TOTAL TIME SPENT ON ADMISSION AND PATIENT CARE:  Fifty minutes.    ____________________________ Starleen Arms, MD dse:ea D: 06/07/2012 03:00:32 ET T: 06/07/2012 23:46:54 ET JOB#: [id number]  cc: Starleen Arms, MD, <Dictator> DAWOOD Teena Irani MD ELECTRONICALLY SIGNED 06/09/2012 2:16

## 2014-08-27 NOTE — H&P (Signed)
PATIENT NAME:  [name], [name] MR#:  [id number] DATE OF BIRTH:  [birth year]  DATE OF ADMISSION:  09/19/2012  PRIMARY CARE PHYSICIAN: Dr. Judithann Sheen.   The patient is a direct admit from Dr. Earnest Conroy office.   CHIEF COMPLAINT: Nausea, vomiting,  diarrhea.   HISTORY OF PRESENT ILLNESS:pt   with history positive  for glaucoma, history of GERD, brought in as a direct admit. The patient was seen iptn Dr. Earnest Conroy office because of nausea, vomiting, diarrhea. The patient started to have diarrhea since last night around 8:00 p.m., had about 4 episodes of diarrhea last night to this morning. In Dr. Earnest Conroy office, he had around 3 episodes of diarrhea. The patient said that he ate she chicken pasta last night around 7:00 p.m., and then around 8:00 p.m. he started to have diarrhea, with 3 episodes of vomiting this morning before the afternoon. The patient did not have any vomiting after that and feels a little bloated. Denies any abdominal pain. Also noted to have low-grade fever at Dr. Earnest Conroy office. Temperature was 99. We are asked to admit directly for gastroenteritis. A CBC was done at Dr. Earnest Conroy office and WBC was 13, so he was referred for direct admission. The patient denies any recent travel.   PAST MEDICAL HISTORY: Significant for  recent EGD and colonoscopy on Monday. The patient's EGD showed Schatzki's ring and that ring was dilated. On colonoscopy, the patient had a polyp in the ascending colon which was removed and it also showed some internal hemorrhoids. The patient's past medical history also includes GERD, is taking Prilosec for that.   ALLERGIES: No known allergies.   SOCIAL HISTORY: The patient does not smoke or drink.   FAMILY HISTORY: No hypertension or diabetes. Significant for mother had brain cancer and father had prostate cancer.   PAST SURGICAL HISTORY: Significant for cyst removal in the neck by Dr. Sibyl Parr; that was a long time back.   REVIEW OF SYSTEMS:    CONSTITUTIONAL:  Had low-grade fever today. No fatigue.  EYES: No blurred vision.  EARS, NOSE, THROAT: No tinnitus. No ear pain. No epistaxis. No difficulty swallowing.  RESPIRATORY: No cough. No wheezing.  CARDIOVASCULAR: No chest pain. No orthopnea.  GASTROINTESTINAL: The patient has nausea, vomiting, diarrhea since yesterday evening. Has a history of GERD for a long time.  GENITOURINARY: No dysuria.  ENDOCRINE: No diabetes.  MUSCULOSKELETAL: The patient has no joint pain.  NEUROLOGIC: No numbness or weakness.  PSYCHIATRIC: No anxiety or insomnia.   PHYSICAL EXAMINATION: VITAL SIGNS: Temperature 97.6. Heart rate is 73, blood pressure 143/68. Temperature is 98 Fahrenheit and sats 94% on room air.  GENERAL: The patient is an elderly male, answering questions appropriately, not in distress and well nourished.  HEENT: Head atraumatic, normocephalic. Pupils equally reacting to light. Extraocular movements are intact. The patient has no conjunctival congestion.  EARS, NOSE, THROAT: No tympanic membrane congestion. No turbinate hypertrophy. No oropharyngeal erythema.  NECK: Thyroid not enlarged. No lymphadenopathy. No JVD. No carotid bruit.  CARDIOVASCULAR: S1, S2 regular. No murmurs. PMI not displaced. Pulses are equal and intact.  LUNGS: Clear to auscultation. Not using accessory muscles for respiration.  The patient has no wheezing.  ABDOMEN: Slightly distended. Bowel sounds present. Nontender. No organomegaly. No hernias.  BACK: The patient has no CVA tenderness.  EXTREMITIES: No extremity edema. No cyanosis. No clubbing.  NEUROLOGIC: Alert, awake, oriented. Cranial nerves II through XII intact. Power 5/5 in upper and lower extremities. Sensations are intact. DTRs 2+ bilaterally.  PSYCHIATRIC: Mood and affect are within normal limits.   LABORATORY DATA: The patient had a CBC done at Dr. Earnest ConroyElliott's office. White count was 13. Electrolytes are ordered stat, but only results available are BUN is 14, creatinine  0.86, sodium 138.    ASSESSMENT AND PLAN: This is a 54( 54 yr old male with nausea, vomiting, diarrhea of sudden onset, likely a viral gastroenteritis. Stool for Clostridium difficile was done, which was negative. The patient is going to be observed overnight. Continue intravenous  fluids and also give intravenous Zofran and intravenous proton pump inhibitors. Check the stool comprehensive cultures and hopefully, symptoms will be resolved by tomorrow. The patient has a history of gastroesophageal reflux disease and recent  Schatzki's ring dilated, so continue intravenous proton pump inhibitors. The patient will be seen by Dr. Mechele CollinElliott tomorrow and signed out to Dr. Judithann SheenSparks.   CONDITION: Stable.   TIME SPENT ON HISTORY AND PHYSICAL: About 55 minutes. Will transfer service to Dr. Judithann SheenSparks.  ____________________________ Katha HammingSnehalatha Ameya Kutz, MD sk:jm D: 09/19/2012 18:42:57 ET T: 09/19/2012 19:19:24 ET JOB#: [id number]  cc: Katha HammingSnehalatha Sela Falk, MD, <Dictator> Duane LopeJeffrey D. Judithann SheenSparks, MD Katha HammingSNEHALATHA Susette Seminara MD ELECTRONICALLY SIGNED 09/30/2012 22:54

## 2014-08-29 NOTE — H&P (Signed)
PATIENT NAME:  [name], [name] DATE OF BIRTH:  26-Feb-1961  DATE OF ADMISSION:  10/25/2011  ER REFERRING PHYSICIAN: Janalyn Harderavid Kaminski, Palmer    PRIMARY CARE PHYSICIAN: The patient has no primary care physician at present.   CHIEF COMPLAINT: Back pain, difficulty urinating, dysuria.   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 His last bilateral lower extremity deep vein thrombosis was in October 2010, at which time he had an IVC filter placed. He had hypercoagulable work-up in October 2010 and was positive for heterozygous Factor II mutation. He followed up with [name] until July 2012 and was recommended to be on long-term anticoagulation. Subsequently, the patient lost his job and was unable to obtain Disability; therefore, he stopped following up with any physician. He also stopped Coumadin after it ran out after his last refill. The patient reports he could not afford it. The patient came into the Emergency Room complaining of severe back pain radiating anteriorly. It started about four days ago. He felt weak, lightheaded, dizzy, and felt like he was going to pass out. He developed leg swelling, difficulty urinating, and dysuria; therefore, he came to the Emergency Room where he was found to have extensive thrombosis in the IVC and bilateral iliac veins. Vascular Surgery was consulted by the ER physician. They recommended admission to the Medical Service and anticoagulation with heparin.   ALLERGIES: No known drug allergies.   PAST MEDICAL HISTORY:  1. 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 The last deep venous thrombosis was bilateral lower extremity deep vein thrombosis in October 2010, at which time a hypercoagulable work-up revealed heterozygous Factor II mutation; and he was advised long-term anticoagulation by [name].  2. Right upper lung mass October 2010  which was intensely PET-positive with possible small hilar lymph nodes. CT-guided biopsy was negative for malignancy.  3. Wedge resection 03/04/2009 was also negative for malignancy.    PAST SURGICAL HISTORY:  1. CT-guided lung biopsy. 2. Wedge resection of lung mass.  3. Appendectomy.  4. Tonsillectomy.  5. Skin graft to left leg and left ankle. 6. Neck fusion surgery.   FAMILY HISTORY: There is no family history of deep vein thrombosis or pulmonary embolism. There is a strong family history of coronary artery disease on the father's side. Father died at age of 952 from MI. Several of his siblings also have had coronary artery disease at a young age. He denies any history of malignancy or hematological disorders. Mother had Alzheimer's dementia and breast cancer.   SOCIAL HISTORY: The patient smokes 1 pack per day. He drinks 3 to 4 12-ounce beers 3 to 4 times a week. He also drinks rum mixed with Coke, about 3 drinks 3 to 4 times a week. He denies any drug abuse. He is currently unemployed.  MEDICATIONS: Aspirin 325 mg daily.   REVIEW OF SYSTEMS: CONSTITUTIONAL: Denies any fever. Reports fatigue, weakness. EYES: Denies any blurred or double vision. ENT: Denies any tinnitus or ear pain. RESPIRATORY: Denies any cough, wheezing. CARDIOVASCULAR: Denies any chest pain or palpitations. GI: Denies any nausea or vomiting. He reports abdominal pain radiating from the back to anteriorly. GENITOURINARY: He reports dysuria, frequency. ENDOCRINE: Denies any polyuria, nocturia. HEMATOLOGIC/LYMPHATIC: Denies any anemia  or easy bruisability. INTEGUMENTARY: Denies any acne, rash. MUSCULOSKELETAL: Denies any swelling, gout. Reports lower extremity swelling. NEUROLOGICAL: Denies any numbness or weakness. PSYCHIATRIC: Denies  any anxiety.   PHYSICAL EXAMINATION:  VITAL SIGNS: Temperature 97.8, heart rate 110, respiratory rate 18, blood pressure 106/74, pulse oximetry 99%.   GENERAL: The patient is a 54 year old  Caucasian male lying comfortably in bed, not in acute distress.   HEENT: Head: Atraumatic, normocephalic. Eyes: There is some pallor. No icterus or cyanosis.  Pupils are equal, round and reactive to light and accommodation.  Extraocular movements are intact. ENT:  Wet mucous membranes. No oropharyngeal erythema or thrush.   NECK: Supple. No masses. No JVD. No thyromegaly or lymphadenopathy.   CHEST WALL: No tenderness to palpation. Not using accessory muscles of respiration. No intercostal muscle retractions.   LUNGS: Bilaterally clear to auscultation. No wheezing, rales, or rhonchi.   CARDIOVASCULAR: S1, S2 regular. No murmurs, rubs or gallops.   ABDOMEN: Soft, nondistended. No guarding, no rigidity. Normal bowel sounds. There is some tenderness to palpation in both the flank area and the left lower quadrant and left groin.   SKIN: No rashes or lesions.   PERIPHERIES: There is pedal edema, 1+ pedal pulses.   MUSCULOSKELETAL: No cyanosis or clubbing.   NEUROLOGICAL: Awake, alert, oriented x3, nonfocal neurological exam. Cranial nerves are grossly intact.   PSYCHIATRIC: Normal mood and affect.   LABORATORY, DIAGNOSTIC AND RADIOLOGICAL DATA:  Ultrasound of the abdomen shows IVC and bilateral iliac vein thrombosis.  CT of the abdomen without contrast shows mild prominence of the IVC filter distal to the filter, subtle hydrogenous increased density, cannot exclude thrombus. Ultrasound is recommended.  No acute hepatobiliary abnormality or urinary tract abnormality.  Glucose 94, BUN 17, creatinine 1.18, sodium 134, potassium 4.5, chloride 101, CO2 27, calcium 9.5.  White count 8.6, hemoglobin 15, hematocrit 45, platelet count 85.   ASSESSMENT AND PLAN: A 54 year old male with past medical history of heterozygous Factor II mutation who was supposed to be on long-term anticoagulation, history of recurrent deep vein thrombosis, status post IVC filter, who stopped Coumadin for financial reasons  one year ago, presents with severe back pain, leg swelling, difficulty urinating.   1. 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 He was advised long-term anticoagulation but stopped Coumadin about a year ago. He has now presented with recurrent thrombosis. We will admit him to the hospital and initiate heparin drip, obtain a Vascular Surgery consultation. The patient has been advised that he is going to require long-term anticoagulation.  2. Dysuria and difficulty urinating: The CT of the abdomen without contrast did not show any urinary tract abnormalities. We will obtain a urinalysis, urine culture and start antibiotics if they are suggestive of any infection.  3. Smoking: The patient was counseled about cessation for more than three minutes. We will provide him with a nicotine patch.  4. Alcohol abuse: He was counseled. The patient reports he has never had alcohol withdrawal in the past. We will watch for alcohol withdrawal and start CIWA protocol if there is any evidence of alcohol withdrawal.  5. The patient has no primary care physician. He will need a referral to [facility] and a primary care physician.   I reviewed all medical records, discussed with the ED physician, discussed with the patient the plan of care and management.   TIME SPENT: 75 minutes.   ____________________________ Darrick Meigs, Palmer sp:cbb D: 10/25/2011 17:50:48 ET T: 10/25/2011 18:48:26 ET JOB#: [id number]  cc: Darrick Meigs, Palmer, <Dictator> [facility]  Palmer ELECTRONICALLY SIGNED 10/25/2011 21:29

## 2014-08-29 NOTE — Discharge Summary (Signed)
PATIENT NAME:  [name], [name] MR#:  [id number] DATE OF BIRTH:  06/05/1960  DATE OF ADMISSION:  10/25/2011 DATE OF DISCHARGE:  11/03/2011  DISCHARGE DIAGNOSES:  1. Inferior vena cava thrombus, celiac vein thrombus. 2. History of factor II mutation. 3. Dysuria. 4. Constipation. 5. Smoking and alcohol dependence.   DISCHARGE MEDICATIONS:  1. Coumadin 10 mg daily. 2. Aspirin 325 mg daily.   DISCHARGE INSTRUCTIONS: The patient needs to followup with Dr. Janese BanksSandeep Pandit in 1 to 2 days and adjust the dose of Coumadin and check the INR there. The patient also has an appointment with the [facility].  HOSPITAL COURSE:  Please see the interim discharge summary done by Dr. Adrian SaranSital Mody on 10/31/2011. The hospital course remains the same. The patient's INR was more than 2 this morning and heparin drip stopped and discharged home with Coumadin. The patient did have a lot of visitors while he was in the hospital.  I advised him to stop smoking and drinking and use the money to take care of the medications, especially Coumadin. The patient is to followup with Dr. Wyn Quakerew, vascular surgeon, as an outpatient regarding his deep vein thromboses and possible IVC filter. Dr. Wyn Quakerew recommended he needs lifelong anticoagulation and use compression stockings regularly. The patient is discharged home in stable condition.   TIME SPENT ON DISCHARGE: More than 30 minutes. ____________________________ Katha HammingSnehalatha Bret Vanessen, MD sk:slb D: 11/03/2011 22:46:32 ET T: 11/04/2011 15:05:48 ET JOB#: [id number]  cc: Katha HammingSnehalatha Nthony Lefferts, MD, <Dictator> [facility] Katha HammingSNEHALATHA Citlalli Weikel MD ELECTRONICALLY SIGNED 11/13/2011 9:36

## 2014-08-29 NOTE — Consult Note (Signed)
Asked to see patient regarding IVC and iliac vein thrombosis.  he had a filter placed 3 years ago.  Has factor 2 mutation.  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  Has several days of leg swelling.  This has gotten some better with heparin, but is still mild to moderate even with elevation.  Will likely need lifelong anti-coagulation and compression stocking use regularly.  Not a great candidate for thrombolysis due to risk of bleeding with thrombocytopenia, but may consider if symptoms do not improve with anticoagulation alone over a week or two.  Will follow  Electronic Signatures: Annice Needyew, Shlomie Romig S (MD)  (Signed on 23-Jun-13 20:20)  Authored  Last Updated: 23-Jun-13 20:20 by Annice Needyew, Kamera Dubas S (MD)

## 2014-09-03 ENCOUNTER — Ambulatory Visit
Admit: 2014-09-03 | Disposition: A | Discharge status: Home / Self Care | Payer: Self-pay | Attending: Internal Medicine | Admitting: Internal Medicine

## 2014-09-03 LAB — CBC CANCER CENTER
BASOS ABS: 0 x10 3/mm (ref 0.0–0.1)
BASOS PCT: 1 %
EOS ABS: 0.1 x10 3/mm (ref 0.0–0.7)
Eosinophil %: 2.7 %
HCT: 43.6 % (ref 40.0–52.0)
HGB: 14.5 g/dL (ref 13.0–18.0)
LYMPHS PCT: 34.2 %
Lymphocyte #: 1.5 x10 3/mm (ref 1.0–3.6)
MCH: 33.8 pg (ref 26.0–34.0)
MCHC: 33.1 g/dL (ref 32.0–36.0)
MCV: 102 fL — ABNORMAL HIGH (ref 80–100)
Monocyte #: 0.3 x10 3/mm (ref 0.2–1.0)
Monocyte %: 7.7 %
Neutrophil #: 2.4 x10 3/mm (ref 1.4–6.5)
Neutrophil %: 54.4 %
Platelet: 100 x10 3/mm — ABNORMAL LOW (ref 150–440)
RBC: 4.27 10*6/uL — ABNORMAL LOW (ref 4.40–5.90)
RDW: 13.3 % (ref 11.5–14.5)
WBC: 4.3 x10 3/mm (ref 3.8–10.6)

## 2014-11-24 ENCOUNTER — Other Ambulatory Visit: Payer: Self-pay

## 2014-11-24 ENCOUNTER — Ambulatory Visit: Payer: Self-pay | Admitting: Internal Medicine

## 2014-12-10 ENCOUNTER — Inpatient Hospital Stay: Payer: Medicaid Other | Admitting: Internal Medicine

## 2014-12-10 ENCOUNTER — Inpatient Hospital Stay: Payer: Medicaid Other

## 2014-12-24 ENCOUNTER — Inpatient Hospital Stay: Payer: Medicaid Other | Admitting: Internal Medicine

## 2014-12-24 ENCOUNTER — Inpatient Hospital Stay: Payer: Medicaid Other

## 2015-01-17 ENCOUNTER — Inpatient Hospital Stay: Payer: Medicaid Other | Attending: Internal Medicine

## 2015-01-17 ENCOUNTER — Inpatient Hospital Stay (HOSPITAL_BASED_OUTPATIENT_CLINIC_OR_DEPARTMENT_OTHER): Payer: Medicaid Other | Admitting: Internal Medicine

## 2015-01-17 VITALS — BP 117/80 | HR 76 | Temp 98.3°F | Resp 18 | Ht 74.0 in | Wt 192.2 lb

## 2015-01-17 DIAGNOSIS — I82493 Acute embolism and thrombosis of other specified deep vein of lower extremity, bilateral: Secondary | ICD-10-CM | POA: Diagnosis not present

## 2015-01-17 DIAGNOSIS — I82409 Acute embolism and thrombosis of unspecified deep veins of unspecified lower extremity: Secondary | ICD-10-CM

## 2015-01-17 DIAGNOSIS — D696 Thrombocytopenia, unspecified: Secondary | ICD-10-CM | POA: Insufficient documentation

## 2015-01-17 DIAGNOSIS — F17210 Nicotine dependence, cigarettes, uncomplicated: Secondary | ICD-10-CM

## 2015-01-17 DIAGNOSIS — Z7901 Long term (current) use of anticoagulants: Secondary | ICD-10-CM | POA: Diagnosis not present

## 2015-01-17 DIAGNOSIS — R918 Other nonspecific abnormal finding of lung field: Secondary | ICD-10-CM | POA: Diagnosis not present

## 2015-01-17 DIAGNOSIS — D689 Coagulation defect, unspecified: Secondary | ICD-10-CM

## 2015-01-17 LAB — CBC WITH DIFFERENTIAL/PLATELET
Basophils Absolute: 0 10*3/uL (ref 0–0.1)
Basophils Relative: 1 %
EOS PCT: 1 %
Eosinophils Absolute: 0.1 10*3/uL (ref 0–0.7)
HCT: 44.9 % (ref 40.0–52.0)
HEMOGLOBIN: 15.1 g/dL (ref 13.0–18.0)
LYMPHS ABS: 1.6 10*3/uL (ref 1.0–3.6)
Lymphocytes Relative: 28 %
MCH: 33.8 pg (ref 26.0–34.0)
MCHC: 33.7 g/dL (ref 32.0–36.0)
MCV: 100.3 fL — ABNORMAL HIGH (ref 80.0–100.0)
MONOS PCT: 6 %
Monocytes Absolute: 0.3 10*3/uL (ref 0.2–1.0)
Neutro Abs: 3.6 10*3/uL (ref 1.4–6.5)
Neutrophils Relative %: 64 %
PLATELETS: 126 10*3/uL — AB (ref 150–440)
RBC: 4.48 MIL/uL (ref 4.40–5.90)
RDW: 13.7 % (ref 11.5–14.5)
WBC: 5.6 10*3/uL (ref 3.8–10.6)

## 2015-01-17 LAB — HEPATIC FUNCTION PANEL
ALBUMIN: 4 g/dL (ref 3.5–5.0)
ALK PHOS: 35 U/L — AB (ref 38–126)
ALT: 45 U/L (ref 17–63)
AST: 36 U/L (ref 15–41)
BILIRUBIN TOTAL: 0.7 mg/dL (ref 0.3–1.2)
Bilirubin, Direct: <0.1 mg/dL — ABNORMAL LOW (ref 0.1–0.5)
Total Protein: 7.3 g/dL (ref 6.5–8.1)

## 2015-01-17 LAB — CREATININE, SERUM
CREATININE: 1.22 mg/dL (ref 0.61–1.24)
GFR calc Af Amer: >60 mL/min (ref >60)
GFR calc non Af Amer: >60 mL/min (ref >60)

## 2015-01-17 LAB — HEPARIN LEVEL (UNFRACTIONATED): HEPARIN UNFRACTIONATED: 1.06 [IU]/mL — AB (ref 0.30–0.70)

## 2015-01-17 NOTE — Progress Notes (Signed)
Patient is here for follow-up. He states that he has been doing pretty good. He states that he still has pain in his legs and rates his pain a 4/10 today.

## 2015-02-03 NOTE — Progress Notes (Signed)
[facility]  [phone] [phone]     ID: Kaylyn Lim OB: [birth year]  MR#: [id number]  JYN#:[id number]  Patient Care Team: Pricilla Holm, MD as PCP - General (Family Medicine)  CHIEF COMPLAINT/DIAGNOSIS:  1. 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 On long term anticoagulation.    2.  Right upper lung mass October 2010, intensely PET positive, with possible small hilar lymph node - CT-guided biopsy negative for malignancy.  Wedge resection on 03/04/09 also negative for malignancy.  3. Lost to f/u since July 2012, patient also stopped Coumadin on his own. Diagnosed with recurrent DVT on 10/25/11 (IVC and b/l iliac vein thrombosis) - referred back here by hospitalist for anticoagulation management.  4. Jan 2014 - admitted with b/l LE cellulitis. Venous doppler showed b/l DVT, likely subacute clots. Anticoagulation therefore changed to SQ Lovenox, then changed to oral Xarelto.    HISTORY OF PRESENT ILLNESS:  Patient returns for continued hematology followup, he was seen in Feb 2016. He is on long-term anticoagulation and currently taking oral Xarelto. States that he is doing fairly steady, denies any recurrent leg swelling or redness. Denies any new cough, shortness of breath or chest pain. Denies bleeding symptoms. Denies any recurent abdominal pain, diarrhea or urinary symptoms. Eating steady. No fevers. States he is working part-time.  REVIEW OF SYSTEMS:   ROS As in HPI above. In addition, no fever, chills or sweats. No new headaches or focal weakness.  No sore throat or dysphagia. No dizziness or palpitation. No abdominal pain, constipation, diarrhea, dysuria or hematuria. No new skin rash or bleeding symptoms. No new paresthesias in extremities.   PAST MEDICAL HISTORY: Reviewed.         Recurrent deep venous thrombosis, in  the left lower extremity about 20+ years ago, right lower extremity about 8 years ago, and bilateral deep vein thrombosis diagnosed October 2010.   Appendectomy.   Tonsillectomy.   Skin graft to left leg and left ankle.   Neck fusion surgery.   PAST SURGICAL HISTORY: Reviewed. As above.   FAMILY HISTORY: Reviewed. Denies deep vein thrombosis or pulmonary embolism but states that there is strong coronary artery disease history on his father's side.  (His father expired at age 31 from myocardial infarction, and a number of his siblings also had coronary artery disease at a younger age. No stroke.)  He denies any other history of malignancy or hematological disorders.  SOCIAL HISTORY: Reviewed. Chronic smoker, 1/2 pack per day for many years. He drinks a 6-pack of beer every other day. 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  Allergies not on file  Current Outpatient Prescriptions  Medication Sig Dispense Refill  . Multiple Vitamin (MULTI-VITAMINS) TABS Take by mouth.    . rivaroxaban (XARELTO) 20 MG TABS tablet Take by mouth.     No current facility-administered medications for this visit.    PHYSICAL EXAM: Filed Vitals:   01/17/15 1503  BP: 117/80  Pulse: 76  Temp: 98.3 F (36.8 C)  Resp: 18     Body mass index is 24.67 kg/(m^2).     GENERAL: Patient is alert and oriented and in no acute distress. There is no icterus. HEENT: EOMs intact. No cervical lymphadenopathy. CVS: S1S2, regular LUNGS: Bilaterally clear to auscultation, no rhonchi. ABDOMEN: Soft, nontender. No hepatosplenomegaly clinically.  EXTREMITIES: trace left pedal edema.   LAB RESULTS:    Component Value  Date/Time   NA 133* 09/19/2012 1415   K 4.2 09/19/2012 1415   CL 101 09/19/2012 1415   CO2 28 09/19/2012 1415   GLUCOSE 113* 09/19/2012 1415   BUN 13 09/19/2012 1415   CREATININE 1.22 01/17/2015 1445   CREATININE 1.11 06/09/2014 1341   CALCIUM 9.8 09/19/2012 1415   PROT 7.3 01/17/2015 1445   PROT  7.9 06/09/2014 1341   ALBUMIN 4.0 01/17/2015 1445   ALBUMIN 3.8 06/09/2014 1341   AST 36 01/17/2015 1445   AST 53* 06/09/2014 1341   ALT 45 01/17/2015 1445   ALT 98* 06/09/2014 1341   ALKPHOS 35* 01/17/2015 1445   ALKPHOS 59 06/09/2014 1341   BILITOT 0.7 01/17/2015 1445   BILITOT 0.8 06/09/2014 1341   GFRNONAA >60 01/17/2015 1445   GFRNONAA >60 06/09/2014 1341   GFRNONAA >60 12/23/2013 1433   GFRAA >60 01/17/2015 1445   GFRAA >60 06/09/2014 1341   GFRAA >60 12/23/2013 1433    Lab Results  Component Value Date   WBC 5.6 01/17/2015   NEUTROABS 3.6 01/17/2015   HGB 15.1 01/17/2015   HCT 44.9 01/17/2015   MCV 100.3* 01/17/2015   PLT 126* 01/17/2015    STUDIES: 07/10/13 - Bilateral LE venous doppler. IMPRESSION:  Chronic bilateral lower extremity DVT with residual occlusive thrombus in the 1 of the branches of the duplicated right common femoral vein, a superficial femoral vein and popliteal vein. Residual thrombus with partial occlusion in the left superficial femoral vein and popliteal vein.   ASSESSMENT / PLAN:   1. 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 Patient had stopped Coumadin on his own but was diagnosed with recurrent DVT on 10/25/11 (IVC and b/l iliac vein thrombosis), had IVC filter and was placed back on anticoagulation (Coumadin + BID ASA) -   have reviewed labs from today and discussed with patient. He continues to do steady clinically, chronic leg symptoms are steady. Last D-dimer still elevated, repeat venous doppler on 07/10/13 reported persistent bilateral chronic DVT. Patient has done fairly well on long-term anticoagulation without any progressive or new thromboembolic phenomena. Patient agreeable to continue on long-term anticoagulation. Will get lab at 16 weeks. Next MD f/u at 32 weeks with labs and make further plan of  management.  2. Right upper lung mass October 2010, intensely PET positive, with possible small hilar lymph node -  no malignancy was found on wedge resection. 3. 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 Continue to monitor. 4. Smoking Cessation - patient wants to try quit on his own and does not want to try patches or medication. In between visits, the patient has been advised to call or come to the ER in case of fevers, bleeding, acute sickness or worsening symptoms. He is agreeable to this plan.     Janese Banks, MD   02/03/2015 9:30 AM

## 2015-05-09 ENCOUNTER — Other Ambulatory Visit: Payer: Medicaid Other

## 2015-05-12 ENCOUNTER — Inpatient Hospital Stay: Payer: Medicaid Other

## 2015-05-16 ENCOUNTER — Inpatient Hospital Stay: Payer: Medicaid Other

## 2015-05-19 ENCOUNTER — Inpatient Hospital Stay: Payer: Medicaid Other | Attending: Internal Medicine

## 2015-05-19 DIAGNOSIS — Z86718 Personal history of other venous thrombosis and embolism: Secondary | ICD-10-CM | POA: Insufficient documentation

## 2015-05-19 DIAGNOSIS — I82409 Acute embolism and thrombosis of unspecified deep veins of unspecified lower extremity: Secondary | ICD-10-CM

## 2015-05-19 LAB — HEPATIC FUNCTION PANEL
ALK PHOS: 50 U/L (ref 38–126)
ALT: 63 U/L (ref 17–63)
AST: 50 U/L — ABNORMAL HIGH (ref 15–41)
Albumin: 4 g/dL (ref 3.5–5.0)
BILIRUBIN DIRECT: 0.1 mg/dL (ref 0.1–0.5)
BILIRUBIN INDIRECT: 0.9 mg/dL (ref 0.3–0.9)
TOTAL PROTEIN: 7.2 g/dL (ref 6.5–8.1)
Total Bilirubin: 1 mg/dL (ref 0.3–1.2)

## 2015-05-19 LAB — CBC WITH DIFFERENTIAL/PLATELET
BASOS ABS: 0.1 10*3/uL (ref 0–0.1)
BASOS PCT: 2 %
EOS PCT: 2 %
Eosinophils Absolute: 0.1 10*3/uL (ref 0–0.7)
HCT: 45.6 % (ref 40.0–52.0)
Hemoglobin: 15.3 g/dL (ref 13.0–18.0)
Lymphocytes Relative: 31 %
Lymphs Abs: 1.9 10*3/uL (ref 1.0–3.6)
MCH: 33.3 pg (ref 26.0–34.0)
MCHC: 33.5 g/dL (ref 32.0–36.0)
MCV: 99.4 fL (ref 80.0–100.0)
Monocytes Absolute: 0.4 10*3/uL (ref 0.2–1.0)
Monocytes Relative: 6 %
Neutro Abs: 3.5 10*3/uL (ref 1.4–6.5)
Neutrophils Relative %: 59 %
PLATELETS: 120 10*3/uL — AB (ref 150–440)
RBC: 4.59 MIL/uL (ref 4.40–5.90)
RDW: 13.3 % (ref 11.5–14.5)
WBC: 6.1 10*3/uL (ref 3.8–10.6)

## 2015-05-19 LAB — CREATININE, SERUM
CREATININE: 1.14 mg/dL (ref 0.61–1.24)
GFR calc Af Amer: >60 mL/min (ref >60)
GFR calc non Af Amer: >60 mL/min (ref >60)

## 2015-07-13 ENCOUNTER — Other Ambulatory Visit: Payer: Self-pay | Admitting: Orthopedic Surgery

## 2015-07-13 DIAGNOSIS — M19011 Primary osteoarthritis, right shoulder: Secondary | ICD-10-CM

## 2015-07-13 DIAGNOSIS — M7581 Other shoulder lesions, right shoulder: Secondary | ICD-10-CM

## 2015-08-04 ENCOUNTER — Ambulatory Visit
Admission: RE | Admit: 2015-08-04 | Discharge: 2015-08-04 | Disposition: A | Discharge status: Home / Self Care | Payer: Medicaid Other | Source: Ambulatory Visit | Attending: Orthopedic Surgery | Admitting: Orthopedic Surgery

## 2015-08-04 DIAGNOSIS — M19011 Primary osteoarthritis, right shoulder: Secondary | ICD-10-CM | POA: Diagnosis present

## 2015-08-04 DIAGNOSIS — M7581 Other shoulder lesions, right shoulder: Secondary | ICD-10-CM | POA: Diagnosis not present

## 2015-08-04 DIAGNOSIS — M67813 Other specified disorders of tendon, right shoulder: Secondary | ICD-10-CM | POA: Diagnosis not present

## 2015-08-04 DIAGNOSIS — S46011A Strain of muscle(s) and tendon(s) of the rotator cuff of right shoulder, initial encounter: Secondary | ICD-10-CM | POA: Diagnosis not present

## 2015-08-04 DIAGNOSIS — M899 Disorder of bone, unspecified: Secondary | ICD-10-CM | POA: Diagnosis not present

## 2015-08-29 ENCOUNTER — Inpatient Hospital Stay: Payer: Medicaid Other | Admitting: Family Medicine

## 2015-08-29 ENCOUNTER — Inpatient Hospital Stay: Payer: Medicaid Other

## 2015-08-29 ENCOUNTER — Other Ambulatory Visit: Payer: Self-pay | Admitting: *Deleted

## 2015-08-29 DIAGNOSIS — D689 Coagulation defect, unspecified: Secondary | ICD-10-CM

## 2015-09-06 ENCOUNTER — Inpatient Hospital Stay: Payer: Medicaid Other | Admitting: Family Medicine

## 2015-09-06 ENCOUNTER — Inpatient Hospital Stay: Payer: Medicaid Other

## 2015-09-09 ENCOUNTER — Inpatient Hospital Stay (HOSPITAL_BASED_OUTPATIENT_CLINIC_OR_DEPARTMENT_OTHER): Payer: Medicaid Other | Admitting: Family Medicine

## 2015-09-09 ENCOUNTER — Inpatient Hospital Stay: Payer: Medicaid Other | Attending: Family Medicine

## 2015-09-09 ENCOUNTER — Encounter: Payer: Self-pay | Admitting: Family Medicine

## 2015-09-09 VITALS — BP 119/82 | HR 84 | Temp 95.0°F | Resp 18 | Ht 74.0 in | Wt 182.4 lb

## 2015-09-09 DIAGNOSIS — I82409 Acute embolism and thrombosis of unspecified deep veins of unspecified lower extremity: Secondary | ICD-10-CM

## 2015-09-09 DIAGNOSIS — R918 Other nonspecific abnormal finding of lung field: Secondary | ICD-10-CM

## 2015-09-09 DIAGNOSIS — Z7901 Long term (current) use of anticoagulants: Secondary | ICD-10-CM | POA: Diagnosis not present

## 2015-09-09 DIAGNOSIS — D696 Thrombocytopenia, unspecified: Secondary | ICD-10-CM

## 2015-09-09 DIAGNOSIS — F17210 Nicotine dependence, cigarettes, uncomplicated: Secondary | ICD-10-CM

## 2015-09-09 DIAGNOSIS — Z86718 Personal history of other venous thrombosis and embolism: Secondary | ICD-10-CM | POA: Insufficient documentation

## 2015-09-09 DIAGNOSIS — R5383 Other fatigue: Secondary | ICD-10-CM | POA: Insufficient documentation

## 2015-09-09 DIAGNOSIS — D689 Coagulation defect, unspecified: Secondary | ICD-10-CM

## 2015-09-09 LAB — CBC WITH DIFFERENTIAL/PLATELET
BASOS PCT: 1 %
Basophils Absolute: 0 10*3/uL (ref 0–0.1)
Eosinophils Absolute: 0.1 10*3/uL (ref 0–0.7)
Eosinophils Relative: 3 %
HEMATOCRIT: 44.1 % (ref 40.0–52.0)
HEMOGLOBIN: 15.2 g/dL (ref 13.0–18.0)
LYMPHS ABS: 0.9 10*3/uL — AB (ref 1.0–3.6)
Lymphocytes Relative: 20 %
MCH: 35 pg — AB (ref 26.0–34.0)
MCHC: 34.4 g/dL (ref 32.0–36.0)
MCV: 101.5 fL — ABNORMAL HIGH (ref 80.0–100.0)
MONO ABS: 0.4 10*3/uL (ref 0.2–1.0)
MONOS PCT: 8 %
NEUTROS ABS: 3.2 10*3/uL (ref 1.4–6.5)
Neutrophils Relative %: 68 %
Platelets: 119 10*3/uL — ABNORMAL LOW (ref 150–440)
RBC: 4.34 MIL/uL — ABNORMAL LOW (ref 4.40–5.90)
RDW: 14.3 % (ref 11.5–14.5)
WBC: 4.7 10*3/uL (ref 3.8–10.6)

## 2015-09-09 LAB — HEPATIC FUNCTION PANEL
ALBUMIN: 4.1 g/dL (ref 3.5–5.0)
ALK PHOS: 58 U/L (ref 38–126)
ALT: 21 U/L (ref 17–63)
AST: 25 U/L (ref 15–41)
BILIRUBIN DIRECT: 0.2 mg/dL (ref 0.1–0.5)
BILIRUBIN TOTAL: 1.2 mg/dL (ref 0.3–1.2)
Indirect Bilirubin: 1 mg/dL — ABNORMAL HIGH (ref 0.3–0.9)
Total Protein: 7.2 g/dL (ref 6.5–8.1)

## 2015-09-09 LAB — CREATININE, SERUM
Creatinine, Ser: 1.22 mg/dL (ref 0.61–1.24)
GFR calc Af Amer: >60 mL/min (ref >60)

## 2015-09-09 NOTE — Progress Notes (Signed)
[facility]  Telephone:(336) [phone]  Fax:(336) [phone]     [name] DOB: February 04, 1961  MR#: [id number]  SFK#:[id number]  Patient Care Team: Lavonne Chick, MD as PCP - General (Family Medicine)  CHIEF COMPLAINT:  Chief Complaint  Patient presents with  . Follow-up    DVT   1. 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 On long term anticoagulation.   2. Right upper lung mass October 2010, intensely PET positive, with possible small hilar lymph node - CT-guided biopsy negative for malignancy. Wedge resection on 03/04/09 also negative for malignancy.  3. Lost to f/u since July 2012, patient also stopped Coumadin on his own. Diagnosed with recurrent DVT on 10/25/11 (IVC and b/l iliac vein thrombosis) - referred back here by hospitalist for anticoagulation management.  4. Jan 2014 - admitted with b/l LE cellulitis. Venous doppler showed b/l DVT, likely subacute clots. Anticoagulation therefore changed to SQ Lovenox, then changed to oral Xarelto.  INTERVAL HISTORY:  Patient returns for continued hematology followup, he was seen in September 2016 by Dr. Ma Hillock. He is on long-term anticoagulation. He reports recently been changed by Dr. Bunnie Domino office from Xarelto to Eliquis due to fatigue. States that he is doing fairly steady, denies any recurrent leg swelling or redness. Denies any new cough, shortness of breath or chest pain. Denies bleeding symptoms. Denies any recurent abdominal pain, diarrhea or urinary symptoms. Eating steady. No fevers. He has recently lost his brother to cancer.   REVIEW OF SYSTEMS:   Review of Systems  Constitutional: Negative for fever, chills, weight loss, malaise/fatigue and diaphoresis.  HENT: Negative.   Eyes: Negative.   Respiratory: Negative for cough, hemoptysis, sputum production, shortness of breath and wheezing.    Cardiovascular: Negative for chest pain, palpitations, orthopnea, claudication, leg swelling and PND.  Gastrointestinal: Negative for heartburn, nausea, vomiting, abdominal pain, diarrhea, constipation, blood in stool and melena.  Genitourinary: Negative.   Musculoskeletal: Negative.   Skin: Negative.   Neurological: Negative for dizziness, tingling, focal weakness, seizures and weakness.  Endo/Heme/Allergies: Does not bruise/bleed easily.  Psychiatric/Behavioral: Negative for depression. The patient is not nervous/anxious and does not have insomnia.     As per HPI. Otherwise, a complete review of systems is negatve.   PAST MEDICAL HISTORY: Recurrent deep venous thrombosis, in the left lower extremity about 20+ years ago, right lower extremity about 8 years ago, and bilateral deep vein thrombosis diagnosed October 2010.  Appendectomy.  Tonsillectomy.  Skin graft to left leg and left ankle.  Neck fusion surgery.   FAMILY HISTORY Denies deep vein thrombosis or pulmonary embolism but states that there is strong coronary artery disease history on his father's side. (His father expired at age 27 from myocardial infarction, and a number of his siblings also had coronary artery disease at a younger age. No stroke.) He denies any other history of malignancy or hematological disorders. Younger brother recently died from cancer of unknown origin.  GYNECOLOGIC HISTORY:  No LMP for male patient.     ADVANCED DIRECTIVES:    HEALTH MAINTENANCE: Social History  Substance Use Topics  . Smoking status: Current Every Day Smoker -- 1.00 packs/day    Types: Cigarettes  . Smokeless tobacco: Not on file  . Alcohol Use: Yes     Comment: 2 pints whiskey a week or maybe 2-5 beers a week       Not on File  Current Outpatient Prescriptions  Medication Sig Dispense Refill  . ELIQUIS 5 MG TABS tablet Take 5 mg by mouth 2 (two) times daily.  5  . Multiple  Vitamin (MULTI-VITAMINS) TABS Take by mouth.    . traMADol (ULTRAM) 50 MG tablet Take by mouth.     No current facility-administered medications for this visit.    OBJECTIVE: BP 119/82 mmHg  Pulse 84  Temp(Src) 95 F (35 C) (Tympanic)  Resp 18  Ht 6' 2"  (1.88 m)  Wt 182 lb 6.9 oz (82.75 kg)  BMI 23.41 kg/m2   Body mass index is 23.41 kg/(m^2).    ECOG FS:1 - Symptomatic but completely ambulatory  General: Well-developed, well-nourished, no acute distress. Eyes: Pink conjunctiva, anicteric sclera. HEENT: Normocephalic, moist mucous membranes, clear oropharnyx. Lungs: Clear to auscultation bilaterally. Heart: Regular rate and rhythm. No rubs, murmurs, or gallops. Musculoskeletal: No edema, cyanosis, or clubbing. Neuro: Alert, answering all questions appropriately. Cranial nerves grossly intact. Skin: No rashes or petechiae noted. Psych: Normal affect.   LAB RESULTS:  Appointment on 09/09/2015  Component Date Value Ref Range Status  . WBC 09/09/2015 4.7  3.8 - 10.6 K/uL Final  . RBC 09/09/2015 4.34* 4.40 - 5.90 MIL/uL Final  . Hemoglobin 09/09/2015 15.2  13.0 - 18.0 g/dL Final  . HCT 09/09/2015 44.1  40.0 - 52.0 % Final  . MCV 09/09/2015 101.5* 80.0 - 100.0 fL Final  . MCH 09/09/2015 35.0* 26.0 - 34.0 pg Final  . MCHC 09/09/2015 34.4  32.0 - 36.0 g/dL Final  . RDW 09/09/2015 14.3  11.5 - 14.5 % Final  . Platelets 09/09/2015 119* 150 - 440 K/uL Final  . Neutrophils Relative % 09/09/2015 68   Final  . Neutro Abs 09/09/2015 3.2  1.4 - 6.5 K/uL Final  . Lymphocytes Relative 09/09/2015 20   Final  . Lymphs Abs 09/09/2015 0.9* 1.0 - 3.6 K/uL Final  . Monocytes Relative 09/09/2015 8   Final  . Monocytes Absolute 09/09/2015 0.4  0.2 - 1.0 K/uL Final  . Eosinophils Relative 09/09/2015 3   Final  . Eosinophils Absolute 09/09/2015 0.1  0 - 0.7 K/uL Final  . Basophils Relative 09/09/2015 1   Final  . Basophils Absolute 09/09/2015 0.0  0 - 0.1 K/uL Final  . Creatinine, Ser  09/09/2015 1.22  0.61 - 1.24 mg/dL Final  . GFR calc non Af Amer 09/09/2015 >60  >60 mL/min Final  . GFR calc Af Amer 09/09/2015 >60  >60 mL/min Final   Comment: (NOTE) The eGFR has been calculated using the CKD EPI equation. This calculation has not been validated in all clinical situations. eGFR's persistently <60 mL/min signify possible Chronic Kidney Disease.   . Total Protein 09/09/2015 7.2  6.5 - 8.1 g/dL Final  . Albumin 09/09/2015 4.1  3.5 - 5.0 g/dL Final  . AST 09/09/2015 25  15 - 41 U/L Final  . ALT 09/09/2015 21  17 - 63 U/L Final  . Alkaline Phosphatase 09/09/2015 58  38 - 126 U/L Final  . Total Bilirubin 09/09/2015 1.2  0.3 - 1.2 mg/dL Final  . Bilirubin, Direct 09/09/2015 0.2  0.1 - 0.5 mg/dL Final  . Indirect Bilirubin 09/09/2015 1.0* 0.3 - 0.9 mg/dL Final    STUDIES: No results found.  ASSESSMENT:   PLAN:   1. Recurrent lower extremity Deep Venous Thrombosis. On long term anticoagulation, Hypercoagulable workup October 2010 remarkable for heterozygous factor II mutation, otherwise negative. Patient had stopped Coumadin on his own but was diagnosed  with recurrent DVT on 10/25/11 (IVC and b/l iliac vein thrombosis), had IVC filter and was placed back on anticoagulation (Coumadin + BID ASA). Repeat venous doppler on 07/10/13 reported persistent bilateral chronic DVT. Patient has done fairly well on long-term anticoagulation without any progressive or new thromboembolic phenomena. Patient agreeable to continue on long-term anticoagulation. Was recently changed from Xarelto to Eliquis by Dr. Lucky Cowboy. Will get lab at 16 weeks. Next MD f/u at 32 weeks with labs and make further plan of management.  2. Right upper lung mass October 2010, intensely PET positive, with possible small hilar lymph node. No malignancy was found on wedge resection. 3. Thrombocytopenia. Most likely from h/o alcohol abuse. Patient reports continued daily use of large volumes of alcohol, approximately a pint a  day of liquor. No bleeding issues. Continue to monitor. 4. Smoking Cessation. Again counseled on smoking cessation. He is not ready to quit smoking.   Patient expressed understanding and was in agreement with this plan. He also understands that He can call clinic at any time with any questions, concerns, or complaints.   Dr. Rogue Bussing was available for consultation and review of plan of care for this patient.   Evlyn Kanner, NP   09/09/2015 12:11 PM

## 2015-09-09 NOTE — Progress Notes (Signed)
Recently lost brother 5 weeks ago pt of Dr. Merlene Pullingorcoran.  Pt complaing of nausea and diarreah today and overall not feeling well.  Been going on for 3 days.  Recently switched from xarelto to eliquis  Dr. Wyn Quakerew switched pt.

## 2015-10-24 ENCOUNTER — Emergency Department: Payer: Medicaid Other

## 2015-10-24 ENCOUNTER — Encounter: Payer: Self-pay | Admitting: Emergency Medicine

## 2015-10-24 ENCOUNTER — Emergency Department
Admission: EM | Admit: 2015-10-24 | Discharge: 2015-10-24 | Disposition: A | Discharge status: Home / Self Care | Payer: Medicaid Other | Attending: Emergency Medicine | Admitting: Emergency Medicine

## 2015-10-24 DIAGNOSIS — Y939 Activity, unspecified: Secondary | ICD-10-CM | POA: Diagnosis not present

## 2015-10-24 DIAGNOSIS — S39012A Strain of muscle, fascia and tendon of lower back, initial encounter: Secondary | ICD-10-CM | POA: Insufficient documentation

## 2015-10-24 DIAGNOSIS — M5416 Radiculopathy, lumbar region: Secondary | ICD-10-CM | POA: Diagnosis not present

## 2015-10-24 DIAGNOSIS — Y929 Unspecified place or not applicable: Secondary | ICD-10-CM | POA: Diagnosis not present

## 2015-10-24 DIAGNOSIS — X58XXXA Exposure to other specified factors, initial encounter: Secondary | ICD-10-CM | POA: Diagnosis not present

## 2015-10-24 DIAGNOSIS — M545 Low back pain, unspecified: Secondary | ICD-10-CM

## 2015-10-24 DIAGNOSIS — Z86718 Personal history of other venous thrombosis and embolism: Secondary | ICD-10-CM | POA: Diagnosis not present

## 2015-10-24 DIAGNOSIS — Y999 Unspecified external cause status: Secondary | ICD-10-CM | POA: Diagnosis not present

## 2015-10-24 DIAGNOSIS — F17210 Nicotine dependence, cigarettes, uncomplicated: Secondary | ICD-10-CM | POA: Insufficient documentation

## 2015-10-24 DIAGNOSIS — Z79899 Other long term (current) drug therapy: Secondary | ICD-10-CM | POA: Diagnosis not present

## 2015-10-24 DIAGNOSIS — F1290 Cannabis use, unspecified, uncomplicated: Secondary | ICD-10-CM | POA: Insufficient documentation

## 2015-10-24 HISTORY — DX: Acute embolism and thrombosis of unspecified deep veins of unspecified lower extremity: I82.409

## 2015-10-24 MED ORDER — OXYCODONE-ACETAMINOPHEN 5-325 MG PO TABS: 1.0000 | ORAL_TABLET | Freq: Three times a day (TID) | ORAL | Status: DC | PRN

## 2015-10-24 MED ORDER — CYCLOBENZAPRINE HCL 5 MG PO TABS: 5.0000 mg | ORAL_TABLET | Freq: Three times a day (TID) | ORAL | Status: DC | PRN

## 2015-10-24 NOTE — Discharge Instructions (Signed)
Lumbosacral Radiculopathy Lumbosacral radiculopathy is a condition that involves the spinal nerves and nerve roots in the low back and bottom of the spine. The condition develops when these nerves and nerve roots move out of place or become inflamed and cause symptoms. CAUSES This condition may be caused by:  Pressure from a disk that bulges out of place (herniated disk). A disk is a plate of cartilage that separates bones in the spine.  Disk degeneration.  A narrowing of the bones of the lower back (spinal stenosis).  A tumor.  An infection.  An injury that places sudden pressure on the disks that cushion the bones of your lower spine. RISK FACTORS This condition is more likely to develop in:  Males aged 30-50 years.  Females aged 71-60 years.  People who lift improperly.  People who are overweight or live a sedentary lifestyle.  People who smoke.  People who perform repetitive activities that strain the spine. SYMPTOMS Symptoms of this condition include:  Pain that goes down from the back into the legs (sciatica). This is the most common symptom. The pain may be worse with sitting, coughing, or sneezing.  Pain and numbness in the arms and legs.  Muscle weakness.  Tingling.  Loss of bladder control or bowel control. DIAGNOSIS This condition is diagnosed with a physical exam and medical history. If the pain is lasting, you may have tests, such as:  MRI scan.  X-ray.  CT scan.  Myelogram.  Nerve conduction study. TREATMENT This condition is often treated with:  Hot packs and ice applied to affected areas.  Stretches to improve flexibility.  Exercises to strengthen back muscles.  Physical therapy.  Pain medicine.  A steroid injection in the spine. In some cases, no treatment is needed. If the condition is long-lasting (chronic), or if symptoms are severe, treatment may involve surgery or lifestyle changes, such as following a weight loss plan. HOME  CARE INSTRUCTIONS Medicines  Take medicines only as directed by your health care provider.  Do not drive or operate heavy machinery while taking pain medicine. Injury Care  Apply a heat pack to the injured area as directed by your health care provider.  Apply ice to the affected area:  Put ice in a plastic bag.  Place a towel between your skin and the bag.  Leave the ice on for 20-30 minutes, every 2 hours while you are awake or as needed. Or, leave the ice on for as long as directed by your health care provider. Other Instructions  If you were shown how to do any exercises or stretches, do them as directed by your health care provider.  If your health care provider prescribed a diet or exercise program, follow it as directed.  Keep all follow-up visits as directed by your health care provider. This is important. SEEK MEDICAL CARE IF:  Your pain does not improve over time even when taking pain medicines. SEEK IMMEDIATE MEDICAL CARE IF:  Your develop severe pain.  Your pain suddenly gets worse.  You develop increasing weakness in your legs.  You lose the ability to control your bladder or bowel.  You have difficulty walking or balancing.  You have a fever.   This information is not intended to replace advice given to you by your health care provider. Make sure you discuss any questions you have with your health care provider.   Document Released: 04/23/2005 Document Revised: 09/07/2014 Document Reviewed: 04/19/2014 Elsevier Interactive Patient Education [employer].  Take the prescription meds as directed. Continue to dose the steroids as previous. [facility] as discussed. Return for worsening symptoms as discussed.
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
## 2015-10-24 NOTE — ED Notes (Signed)
Pt presents with low back pain for one mth. Comes and goes, thinks it might be a pulled muscle.

## 2015-10-24 NOTE — ED Notes (Signed)
Intermittent lower back pain for several months denies any injury  Ambulates well to treatment room

## 2015-11-14 ENCOUNTER — Ambulatory Visit
Admission: RE | Admit: 2015-11-14 | Discharge: 2015-11-14 | Disposition: A | Discharge status: Home / Self Care | Payer: Medicaid Other | Source: Ambulatory Visit | Attending: Family Medicine | Admitting: Family Medicine

## 2015-11-14 ENCOUNTER — Other Ambulatory Visit: Payer: Self-pay | Admitting: Family Medicine

## 2015-11-14 DIAGNOSIS — R634 Abnormal weight loss: Secondary | ICD-10-CM | POA: Insufficient documentation

## 2015-12-21 ENCOUNTER — Other Ambulatory Visit
Admission: RE | Admit: 2015-12-21 | Discharge: 2015-12-21 | Disposition: A | Discharge status: Home / Self Care | Payer: Medicaid Other | Source: Ambulatory Visit | Attending: Family Medicine | Admitting: Family Medicine

## 2015-12-21 DIAGNOSIS — B1920 Unspecified viral hepatitis C without hepatic coma: Secondary | ICD-10-CM | POA: Insufficient documentation

## 2015-12-22 LAB — HCV RNA QUANT
HCV QUANT LOG: 4.461 {Log_IU}/mL (ref >1.70)
HCV QUANT: 28900 [IU]/mL (ref >50)

## 2015-12-23 LAB — HEPATITIS C GENOTYPE

## 2016-01-02 ENCOUNTER — Observation Stay
Admission: EM | Admit: 2016-01-02 | Discharge: 2016-01-03 | Disposition: A | Discharge status: Home / Self Care | Payer: Medicaid Other | Attending: Specialist | Admitting: Specialist

## 2016-01-02 ENCOUNTER — Encounter: Payer: Self-pay | Admitting: Emergency Medicine

## 2016-01-02 DIAGNOSIS — R7989 Other specified abnormal findings of blood chemistry: Secondary | ICD-10-CM | POA: Diagnosis present

## 2016-01-02 DIAGNOSIS — Z86718 Personal history of other venous thrombosis and embolism: Secondary | ICD-10-CM | POA: Diagnosis not present

## 2016-01-02 DIAGNOSIS — R778 Other specified abnormalities of plasma proteins: Secondary | ICD-10-CM | POA: Insufficient documentation

## 2016-01-02 DIAGNOSIS — I951 Orthostatic hypotension: Secondary | ICD-10-CM | POA: Diagnosis present

## 2016-01-02 DIAGNOSIS — R55 Syncope and collapse: Secondary | ICD-10-CM

## 2016-01-02 DIAGNOSIS — Z8249 Family history of ischemic heart disease and other diseases of the circulatory system: Secondary | ICD-10-CM | POA: Diagnosis not present

## 2016-01-02 DIAGNOSIS — R531 Weakness: Secondary | ICD-10-CM

## 2016-01-02 DIAGNOSIS — D682 Hereditary deficiency of other clotting factors: Secondary | ICD-10-CM | POA: Insufficient documentation

## 2016-01-02 DIAGNOSIS — F17210 Nicotine dependence, cigarettes, uncomplicated: Secondary | ICD-10-CM | POA: Diagnosis not present

## 2016-01-02 DIAGNOSIS — M1990 Unspecified osteoarthritis, unspecified site: Secondary | ICD-10-CM | POA: Diagnosis not present

## 2016-01-02 DIAGNOSIS — Z7901 Long term (current) use of anticoagulants: Secondary | ICD-10-CM | POA: Insufficient documentation

## 2016-01-02 DIAGNOSIS — Z79899 Other long term (current) drug therapy: Secondary | ICD-10-CM | POA: Diagnosis not present

## 2016-01-02 LAB — HEPATIC FUNCTION PANEL
ALT: 16 U/L — AB (ref 17–63)
AST: 26 U/L (ref 15–41)
Albumin: 4 g/dL (ref 3.5–5.0)
Alkaline Phosphatase: 47 U/L (ref 38–126)
BILIRUBIN DIRECT: 0.2 mg/dL (ref 0.1–0.5)
BILIRUBIN TOTAL: 0.7 mg/dL (ref 0.3–1.2)
Indirect Bilirubin: 0.5 mg/dL (ref 0.3–0.9)
Total Protein: 7.5 g/dL (ref 6.5–8.1)

## 2016-01-02 LAB — PROTIME-INR
INR: 0.85
Prothrombin Time: 11.6 seconds (ref 11.4–15.2)

## 2016-01-02 LAB — BASIC METABOLIC PANEL
Anion gap: 10 (ref 5–15)
BUN: 11 mg/dL (ref 6–20)
CHLORIDE: 102 mmol/L (ref 101–111)
CO2: 23 mmol/L (ref 22–32)
CREATININE: 0.83 mg/dL (ref 0.61–1.24)
Calcium: 9.2 mg/dL (ref 8.9–10.3)
GFR calc non Af Amer: >60 mL/min (ref >60)
Glucose, Bld: 91 mg/dL (ref 65–99)
POTASSIUM: 4 mmol/L (ref 3.5–5.1)
Sodium: 135 mmol/L (ref 135–145)

## 2016-01-02 LAB — CBC
HEMATOCRIT: 47 % (ref 40.0–52.0)
HEMOGLOBIN: 16.2 g/dL (ref 13.0–18.0)
MCH: 35.2 pg — AB (ref 26.0–34.0)
MCHC: 34.6 g/dL (ref 32.0–36.0)
MCV: 101.9 fL — AB (ref 80.0–100.0)
PLATELETS: 116 10*3/uL — AB (ref 150–440)
RBC: 4.61 MIL/uL (ref 4.40–5.90)
RDW: 13.4 % (ref 11.5–14.5)
WBC: 6.3 10*3/uL (ref 3.8–10.6)

## 2016-01-02 LAB — TROPONIN I: TROPONIN I: 0.07 ng/mL — AB (ref <0.03)

## 2016-01-02 MED ORDER — SODIUM CHLORIDE 0.9 % IV SOLN
Freq: Once | INTRAVENOUS | Status: AC
  Administered 2016-01-02: 19:00:00 via INTRAVENOUS

## 2016-01-02 NOTE — ED Triage Notes (Signed)
C/O feeling weak and dizzy x 3 weeks.  Seen PCP about 6 weeks ago for same complaint and had referred patient to a cardiologist, but symptoms resolved and patient did not have any follow up.

## 2016-01-02 NOTE — H&P (Signed)
[facility]  at [facility]   PATIENT NAME: [name]    MR#:  [id number]  DATE OF BIRTH:  12/30/1960  DATE OF ADMISSION:  01/02/2016  PRIMARY CARE PHYSICIAN: Pricilla HolmSHARPE, LESLIE M, MD   REQUESTING/REFERRING PHYSICIAN: Mayford KnifeWilliams, MD  CHIEF COMPLAINT:   Chief Complaint  Patient presents with  . Dizziness    HISTORY OF PRESENT ILLNESS:  [name]  is a 55 y.o. male who presents with Episodes of dizziness and near syncope. Patient states that this typically happens when he goes from a sitting to standing position. He denies any palpitations or chest pain. However, on evaluation in the ED today he was found to have an elevated troponin. Hospitals were called for admission and further evaluation.  PAST MEDICAL HISTORY:   Past Medical History:  Diagnosis Date  . DVT (deep venous thrombosis) (HCC)     PAST SURGICAL HISTORY:   Past Surgical History:  Procedure Laterality Date  . APPENDECTOMY    . TONSILLECTOMY      SOCIAL HISTORY:   Social History  Substance Use Topics  . Smoking status: Current Every Day Smoker    Packs/day: 1.00    Types: Cigarettes  . Smokeless tobacco: Never Used  . Alcohol use Yes     Comment: 2 pints whiskey a week or maybe 2-5 beers a week     FAMILY HISTORY:   Family History  Problem Relation Age of Onset  . Heart attack Father   . Heart attack Brother     DRUG ALLERGIES:  No Known Allergies  MEDICATIONS AT HOME:   Prior to Admission medications   Medication Sig Start Date End Date Taking? Authorizing Provider  cyclobenzaprine (FLEXERIL) 5 MG tablet Take 1 tablet (5 mg total) by mouth 3 (three) times daily as needed for muscle spasms. 10/24/15  Yes Jenise V Bacon Menshew, PA-C  ELIQUIS 5 MG TABS tablet Take 5 mg by mouth 2 (two) times daily. 07/26/15  Yes Historical Provider, MD  milk thistle 175 MG tablet Take 175 mg by mouth daily.   Yes Historical Provider, MD  traMADol Janean Sark(ULTRAM) 50 MG tablet Take by  mouth. 07/02/15   Historical Provider, MD    REVIEW OF SYSTEMS:  Review of Systems  Constitutional: Negative for chills, fever, malaise/fatigue and weight loss.  HENT: Negative for ear pain, hearing loss and tinnitus.   Eyes: Negative for blurred vision, double vision, pain and redness.  Respiratory: Negative for cough, hemoptysis and shortness of breath.   Cardiovascular: Negative for chest pain, palpitations, orthopnea and leg swelling.       Near syncope  Gastrointestinal: Negative for abdominal pain, constipation, diarrhea, nausea and vomiting.  Genitourinary: Negative for dysuria, frequency and hematuria.  Musculoskeletal: Negative for back pain, joint pain and neck pain.  Skin:       No acne, rash, or lesions  Neurological: Positive for dizziness and weakness. Negative for tremors and focal weakness.  Endo/Heme/Allergies: Negative for polydipsia. Does not bruise/bleed easily.  Psychiatric/Behavioral: Negative for depression. The patient is not nervous/anxious and does not have insomnia.      VITAL SIGNS:   Vitals:   01/02/16 1725 01/02/16 1726  BP: 119/85   Pulse: 83   Resp: 16   Temp: 98.3 F (36.8 C)   TempSrc: Oral   SpO2: 100%   Weight:  81.6 kg (180 lb)  Height:  6\' 2"  (1.88 m)   Wt Readings from Last 3 Encounters:  01/02/16 81.6 kg (180  lb)  09/09/15 82.7 kg (182 lb 6.9 oz)  01/17/15 87.2 kg (192 lb 3.9 oz)    PHYSICAL EXAMINATION:  Physical Exam  Vitals reviewed. Constitutional: He is oriented to person, place, and time. He appears well-developed and well-nourished. No distress.  HENT:  Head: Normocephalic and atraumatic.  Mouth/Throat: Oropharynx is clear and moist.  Eyes: Conjunctivae and EOM are normal. Pupils are equal, round, and reactive to light. No scleral icterus.  Neck: Normal range of motion. Neck supple. No JVD present. No thyromegaly present.  Cardiovascular: Normal rate, regular rhythm and intact distal pulses.  Exam reveals no gallop and no  friction rub.   No murmur heard. Respiratory: Effort normal and breath sounds normal. No respiratory distress. He has no wheezes. He has no rales.  GI: Soft. Bowel sounds are normal. He exhibits no distension. There is no tenderness.  Musculoskeletal: Normal range of motion. He exhibits no edema.  No arthritis, no gout  Lymphadenopathy:    He has no cervical adenopathy.  Neurological: He is alert and oriented to person, place, and time. No cranial nerve deficit.  No dysarthria, no aphasia  Skin: Skin is warm and dry. No rash noted. No erythema.  Psychiatric: He has a normal mood and affect. His behavior is normal. Judgment and thought content normal.    LABORATORY PANEL:   CBC  Recent Labs Lab 01/02/16 1728  WBC 6.3  HGB 16.2  HCT 47.0  PLT 116*   ------------------------------------------------------------------------------------------------------------------  Chemistries   Recent Labs Lab 01/02/16 1728  NA 135  K 4.0  CL 102  CO2 23  GLUCOSE 91  BUN 11  CREATININE 0.83  CALCIUM 9.2  AST 26  ALT 16*  ALKPHOS 47  BILITOT 0.7   ------------------------------------------------------------------------------------------------------------------  Cardiac Enzymes  Recent Labs Lab 01/02/16 1728  TROPONINI 0.07*   ------------------------------------------------------------------------------------------------------------------  RADIOLOGY:  No results found.  EKG:   Orders placed or performed during the hospital encounter of 01/02/16  . ED EKG  . ED EKG    IMPRESSION AND PLAN:  Principal Problem:   Elevated troponin - low suspicion for ACS, the patient does have a strong family history of heart disease. We will trend his cardiac enzymes tonight, get an echocardiogram in the morning. However, given his symptoms fitting clinically much more as orthostatic hypotension, we will check orthostatic vital signs as well. Cardiology consult. Active Problems:    Weakness - and near syncope, mostly when he goes from a sitting to standing position. Suspect potential orthostatic hypotension. We will check orthostatic vital signs. Hydrate gently with IV fluids tonight.   History of DVT (deep vein thrombosis) - continue anticoagulation  All the records are reviewed and case discussed with ED provider. Management plans discussed with the patient and/or family.  DVT PROPHYLAXIS: Systemic anticoagulation  GI PROPHYLAXIS: None  ADMISSION STATUS: Observation  CODE STATUS: Full Code Status History    This patient does not have a recorded code status. Please follow your organizational policy for patients in this situation.      TOTAL TIME TAKING CARE OF THIS PATIENT: 40 minutes.    Rula Keniston FIELDING 01/02/2016, 10:39 PM  [facility]  Office  [phone]  CC: Primary care physician; Pricilla Holm, MD

## 2016-01-02 NOTE — ED Notes (Signed)
Report given to Kimrey, RN 

## 2016-01-02 NOTE — ED Notes (Signed)
Pt in via triage with complaints of feelings as if he is going to pass out multiple times over the last two weeks.  Pt reports getting light headed, feeling numb/tingly all over during these episodes.  Pt reports this was addressed by his PCP approximately a month ago and was referred to cardiology but did not go.  Pt A/Ox4, no immediate distress noted.

## 2016-01-02 NOTE — ED Provider Notes (Signed)
[facility] Emergency Department Provider Note        Time seen: ----------------------------------------- 7:25 PM on 01/02/2016 -----------------------------------------    I have reviewed the triage vital signs and the nursing notes.   HISTORY  Chief Complaint Dizziness    HPI [name] is a 55 y.o. male who presents to ER for weakness and dizziness for the last 3 weeks. Patient was seen by his primary care doctor about 6 weeks ago for same complaint and was referred to a cardiologist. Patient states symptoms resolved but he did not have any follow-up. Patient states his symptoms came back and her worse whenever he stands up. He denies any recent illness, does report daily alcohol intake that is decreased over the past several years.   Past Medical History:  Diagnosis Date  . DVT (deep venous thrombosis) (HCC)     There are no active problems to display for this patient.   History reviewed. No pertinent surgical history.  Allergies Review of patient's allergies indicates no known allergies.  Social History Social History  Substance Use Topics  . Smoking status: Current Every Day Smoker    Packs/day: 1.00    Types: Cigarettes  . Smokeless tobacco: Never Used  . Alcohol use Yes     Comment: 2 pints whiskey a week or maybe 2-5 beers a week     Review of Systems Constitutional: Negative for fever. Cardiovascular: Negative for chest pain. Respiratory: Negative for shortness of breath. Gastrointestinal: Negative for abdominal pain, vomiting and diarrhea. Genitourinary: Negative for dysuria. Musculoskeletal: Negative for back pain. Skin: Negative for rash. Neurological: Negative for headaches,Positive for weakness  10-point ROS otherwise negative.  ____________________________________________   PHYSICAL EXAM:  VITAL SIGNS: ED Triage Vitals  Enc Vitals Group     BP 01/02/16 1725 119/85     Pulse Rate 01/02/16 1725 83      Resp 01/02/16 1725 16     Temp 01/02/16 1725 98.3 F (36.8 C)     Temp Source 01/02/16 1725 Oral     SpO2 01/02/16 1725 100 %     Weight 01/02/16 1726 180 lb (81.6 kg)     Height 01/02/16 1726 6\' 2"  (1.88 m)     Head Circumference --      Peak Flow --      Pain Score 01/02/16 1726 0     Pain Loc --      Pain Edu? --      Excl. in GC? --     Constitutional: Alert and oriented. Thin, no distress Eyes: Conjunctivae are normal. PERRL. Normal extraocular movements. ENT   Head: Normocephalic and atraumatic.   Nose: No congestion/rhinnorhea.   Mouth/Throat: Mucous membranes are moist.   Neck: No stridor. Cardiovascular: Normal rate, regular rhythm. No murmurs, rubs, or gallops. Respiratory: Normal respiratory effort without tachypnea nor retractions. Breath sounds are clear and equal bilaterally. No wheezes/rales/rhonchi. Gastrointestinal: Soft and nontender. Normal bowel sounds Musculoskeletal: Nontender with normal range of motion in all extremities. No lower extremity tenderness nor edema. Thenar wasting Neurologic:  Normal speech and language. No gross focal neurologic deficits are appreciated.  Skin:  Skin is warm, dry and intact. Superficial telangiectasia Psychiatric: Mood and affect are normal. Speech and behavior are normal.  ____________________________________________  EKG: Interpreted by me. Normal sinus rhythm rate of 66 bpm, normal PR interval, normal QRS, normal QT interval. Normal axis.  ____________________________________________  ED COURSE:  Pertinent labs & imaging results that were available during my care  of the patient were reviewed by me and considered in my medical decision making (see chart for details). Clinical Course  Patient presents to ER with dizziness, we will assess with orthostatics and possibly give fluids if needed.  Procedures ____________________________________________   LABS (pertinent positives/negatives)  Labs Reviewed   CBC - Abnormal; Notable for the following:       Result Value   MCV 101.9 (*)    MCH 35.2 (*)    Platelets 116 (*)    All other components within normal limits  HEPATIC FUNCTION PANEL - Abnormal; Notable for the following:    ALT 16 (*)    All other components within normal limits  TROPONIN I - Abnormal; Notable for the following:    Troponin I 0.07 (*)    All other components within normal limits  BASIC METABOLIC PANEL  PROTIME-INR  URINALYSIS COMPLETEWITH MICROSCOPIC ([facility] ONLY)  CBG MONITORING, ED    ____________________________________________  FINAL ASSESSMENT AND PLAN  Orthostatic hypotension, chronic alcoholism, Elevated troponin, near-syncope  Plan: Patient with labs as dictated above. Patient presented to the ER with near syncopal events of uncertain etiology. He was orthostatic here and received IV fluids, troponin was found to be elevated. EKG was nondiagnostic, I will discuss with the hospitalist for admission.   Emily FilbertWilliams, Shervin Cypert E, MD   Note: This dictation was prepared with Dragon dictation. Any transcriptional errors that result from this process are unintentional    Emily FilbertJonathan E Jamichael Knotts, MD 01/02/16 2040

## 2016-01-03 ENCOUNTER — Observation Stay: Admit: 2016-01-03 | Payer: Medicaid Other

## 2016-01-03 ENCOUNTER — Encounter: Payer: Self-pay | Admitting: *Deleted

## 2016-01-03 LAB — BASIC METABOLIC PANEL
Anion gap: 6 (ref 5–15)
BUN: 11 mg/dL (ref 6–20)
CHLORIDE: 107 mmol/L (ref 101–111)
CO2: 25 mmol/L (ref 22–32)
Calcium: 8.7 mg/dL — ABNORMAL LOW (ref 8.9–10.3)
Creatinine, Ser: 0.84 mg/dL (ref 0.61–1.24)
GFR calc Af Amer: >60 mL/min (ref >60)
GFR calc non Af Amer: >60 mL/min (ref >60)
GLUCOSE: 112 mg/dL — AB (ref 65–99)
POTASSIUM: 4 mmol/L (ref 3.5–5.1)
SODIUM: 138 mmol/L (ref 135–145)

## 2016-01-03 LAB — CBC
HEMATOCRIT: 42.3 % (ref 40.0–52.0)
HEMOGLOBIN: 14.6 g/dL (ref 13.0–18.0)
MCH: 34.7 pg — AB (ref 26.0–34.0)
MCHC: 34.5 g/dL (ref 32.0–36.0)
MCV: 100.6 fL — AB (ref 80.0–100.0)
Platelets: 109 10*3/uL — ABNORMAL LOW (ref 150–440)
RBC: 4.21 MIL/uL — AB (ref 4.40–5.90)
RDW: 13.5 % (ref 11.5–14.5)
WBC: 5.1 10*3/uL (ref 3.8–10.6)

## 2016-01-03 LAB — URINALYSIS COMPLETE WITH MICROSCOPIC (ARMC ONLY)
BILIRUBIN URINE: NEGATIVE
Bacteria, UA: NONE SEEN
Glucose, UA: NEGATIVE mg/dL
HGB URINE DIPSTICK: NEGATIVE
KETONES UR: NEGATIVE mg/dL
LEUKOCYTES UA: NEGATIVE
NITRITE: NEGATIVE
PH: 7 (ref 5.0–8.0)
PROTEIN: NEGATIVE mg/dL
SPECIFIC GRAVITY, URINE: 1.01 (ref 1.005–1.030)
Squamous Epithelial / LPF: NONE SEEN

## 2016-01-03 LAB — TROPONIN I: Troponin I: <0.03 ng/mL (ref <0.03)

## 2016-01-03 MED ORDER — SODIUM CHLORIDE 0.9 % IV BOLUS (SEPSIS)
1000.0000 mL | Freq: Once | INTRAVENOUS | Status: AC
  Administered 2016-01-03: 1000 mL via INTRAVENOUS

## 2016-01-03 MED ORDER — ONDANSETRON HCL 4 MG PO TABS: 4.0000 mg | ORAL_TABLET | Freq: Four times a day (QID) | ORAL | Status: DC | PRN

## 2016-01-03 MED ORDER — ACETAMINOPHEN 325 MG PO TABS: 650.0000 mg | ORAL_TABLET | Freq: Four times a day (QID) | ORAL | Status: DC | PRN

## 2016-01-03 MED ORDER — SODIUM CHLORIDE 0.9% FLUSH
3.0000 mL | Freq: Two times a day (BID) | INTRAVENOUS | Status: DC
  Administered 2016-01-03 (×2): 3 mL via INTRAVENOUS

## 2016-01-03 MED ORDER — APIXABAN 5 MG PO TABS
5.0000 mg | ORAL_TABLET | Freq: Two times a day (BID) | ORAL | Status: DC
  Administered 2016-01-03 (×2): 5 mg via ORAL
  Filled 2016-01-03 (×2): qty 1

## 2016-01-03 MED ORDER — ONDANSETRON HCL 4 MG/2ML IJ SOLN: 4.0000 mg | Freq: Four times a day (QID) | INTRAMUSCULAR | Status: DC | PRN

## 2016-01-03 MED ORDER — ACETAMINOPHEN 650 MG RE SUPP: 650.0000 mg | Freq: Four times a day (QID) | RECTAL | Status: DC | PRN

## 2016-01-03 NOTE — H&P (Signed)
[name] is a 55 y.o. male  [id number]  Primary Cardiologist: Adrian Blackwater Reason for Consultation: Presyncope  HPI: This is a 55 year old white male with a past medical history of DVT presented to the hospital with the episode where he passed out one month ago and since then been feeling dizzy when he gets up. His initial troponin was mildly elevated but the second and third troponin were unremarkable      Past Medical History:  Diagnosis Date  . DVT (deep venous thrombosis) (HCC)     Medications Prior to Admission  Medication Sig Dispense Refill  . cyclobenzaprine (FLEXERIL) 5 MG tablet Take 1 tablet (5 mg total) by mouth 3 (three) times daily as needed for muscle spasms. 15 tablet 0  . ELIQUIS 5 MG TABS tablet Take 5 mg by mouth 2 (two) times daily.  5  . milk thistle 175 MG tablet Take 175 mg by mouth daily.    . traMADol (ULTRAM) 50 MG tablet Take by mouth.       Marland Kitchen apixaban  5 mg Oral BID  . sodium chloride flush  3 mL Intravenous Q12H    Infusions:    No Known Allergies  Social History   Social History  . Marital status: Single    Spouse name: N/A  . Number of children: N/A  . Years of education: N/A   Occupational History  . Not on file.   Social History Main Topics  . Smoking status: Current Every Day Smoker    Packs/day: 1.00    Types: Cigarettes  . Smokeless tobacco: Never Used  . Alcohol use Yes     Comment: 2 pints whiskey a week or maybe 2-5 beers a week   . Drug use:     Types: Marijuana  . Sexual activity: Not on file   Other Topics Concern  . Not on file   Social History Narrative  . No narrative on file    Family History  Problem Relation Age of Onset  . Heart attack Father   . Heart attack Brother     PHYSICAL EXAM: Vitals:   01/03/16 0422 01/03/16 0735  BP: (!) 89/56 117/74  Pulse: 90 64  Resp:  18  Temp:       Intake/Output Summary (Last 24 hours) at 01/03/16 0858 Last data filed at 01/03/16 0049  Gross  per 24 hour  Intake              243 ml  Output                0 ml  Net              243 ml    General:  Well appearing. No respiratory difficulty HEENT: normal Neck: supple. no JVD. Carotids 2+ bilat; no bruits. No lymphadenopathy or thryomegaly appreciated. Cor: PMI nondisplaced. Regular rate & rhythm. No rubs, gallops or murmurs. Lungs: clear Abdomen: soft, nontender, nondistended. No hepatosplenomegaly. No bruits or masses. Good bowel sounds. Extremities: no cyanosis, clubbing, rash, edema Neuro: alert & oriented x 3, cranial nerves grossly intact. moves all 4 extremities w/o difficulty. Affect pleasant.  ECG: NSR   Results for orders placed or performed during the hospital encounter of 01/02/16 (from the past 24 hour(s))  Basic metabolic panel     Status: None   Collection Time: 01/02/16  5:28 PM  Result Value Ref Range   Sodium 135 135 - 145 mmol/L   Potassium 4.0  3.5 - 5.1 mmol/L   Chloride 102 101 - 111 mmol/L   CO2 23 22 - 32 mmol/L   Glucose, Bld 91 65 - 99 mg/dL   BUN 11 6 - 20 mg/dL   Creatinine, Ser 1.61 0.61 - 1.24 mg/dL   Calcium 9.2 8.9 - 09.6 mg/dL   GFR calc non Af Amer >60 >60 mL/min   GFR calc Af Amer >60 >60 mL/min   Anion gap 10 5 - 15  CBC     Status: Abnormal   Collection Time: 01/02/16  5:28 PM  Result Value Ref Range   WBC 6.3 3.8 - 10.6 K/uL   RBC 4.61 4.40 - 5.90 MIL/uL   Hemoglobin 16.2 13.0 - 18.0 g/dL   HCT 04.5 40.9 - 81.1 %   MCV 101.9 (H) 80.0 - 100.0 fL   MCH 35.2 (H) 26.0 - 34.0 pg   MCHC 34.6 32.0 - 36.0 g/dL   RDW 91.4 78.2 - 95.6 %   Platelets 116 (L) 150 - 440 K/uL  Hepatic function panel     Status: Abnormal   Collection Time: 01/02/16  5:28 PM  Result Value Ref Range   Total Protein 7.5 6.5 - 8.1 g/dL   Albumin 4.0 3.5 - 5.0 g/dL   AST 26 15 - 41 U/L   ALT 16 (L) 17 - 63 U/L   Alkaline Phosphatase 47 38 - 126 U/L   Total Bilirubin 0.7 0.3 - 1.2 mg/dL   Bilirubin, Direct 0.2 0.1 - 0.5 mg/dL   Indirect Bilirubin 0.5 0.3 -  0.9 mg/dL  Protime-INR     Status: None   Collection Time: 01/02/16  5:28 PM  Result Value Ref Range   Prothrombin Time 11.6 11.4 - 15.2 seconds   INR 0.85   Troponin I     Status: Abnormal   Collection Time: 01/02/16  5:28 PM  Result Value Ref Range   Troponin I 0.07 (HH) <0.03 ng/mL  Urinalysis complete, with microscopic     Status: Abnormal   Collection Time: 01/03/16 12:09 AM  Result Value Ref Range   Color, Urine YELLOW (A) YELLOW   APPearance CLEAR (A) CLEAR   Glucose, UA NEGATIVE NEGATIVE mg/dL   Bilirubin Urine NEGATIVE NEGATIVE   Ketones, ur NEGATIVE NEGATIVE mg/dL   Specific Gravity, Urine 1.010 1.005 - 1.030   Hgb urine dipstick NEGATIVE NEGATIVE   pH 7.0 5.0 - 8.0   Protein, ur NEGATIVE NEGATIVE mg/dL   Nitrite NEGATIVE NEGATIVE   Leukocytes, UA NEGATIVE NEGATIVE   RBC / HPF 0-5 0 - 5 RBC/hpf   WBC, UA 0-5 0 - 5 WBC/hpf   Bacteria, UA NONE SEEN NONE SEEN   Squamous Epithelial / LPF NONE SEEN NONE SEEN   Mucous PRESENT   Troponin I     Status: None   Collection Time: 01/03/16 12:09 AM  Result Value Ref Range   Troponin I <0.03 <0.03 ng/mL  Troponin I     Status: None   Collection Time: 01/03/16  4:52 AM  Result Value Ref Range   Troponin I <0.03 <0.03 ng/mL  Basic metabolic panel     Status: Abnormal   Collection Time: 01/03/16  4:52 AM  Result Value Ref Range   Sodium 138 135 - 145 mmol/L   Potassium 4.0 3.5 - 5.1 mmol/L   Chloride 107 101 - 111 mmol/L   CO2 25 22 - 32 mmol/L   Glucose, Bld 112 (H) 65 - 99 mg/dL   BUN  11 6 - 20 mg/dL   Creatinine, Ser 7.820.84 0.61 - 1.24 mg/dL   Calcium 8.7 (L) 8.9 - 10.3 mg/dL   GFR calc non Af Amer >60 >60 mL/min   GFR calc Af Amer >60 >60 mL/min   Anion gap 6 5 - 15  CBC     Status: Abnormal   Collection Time: 01/03/16  4:52 AM  Result Value Ref Range   WBC 5.1 3.8 - 10.6 K/uL   RBC 4.21 (L) 4.40 - 5.90 MIL/uL   Hemoglobin 14.6 13.0 - 18.0 g/dL   HCT 95.642.3 21.340.0 - 08.652.0 %   MCV 100.6 (H) 80.0 - 100.0 fL   MCH 34.7  (H) 26.0 - 34.0 pg   MCHC 34.5 32.0 - 36.0 g/dL   RDW 57.813.5 46.911.5 - 62.914.5 %   Platelets 109 (L) 150 - 440 K/uL   No results found.   ASSESSMENT AND PLAN: Presyncope  KHAN,SHAUKAT A

## 2016-01-03 NOTE — Progress Notes (Signed)
Discharge instructions explained to pt/ verbalized an understanding/ iv and tele removed/ will transport off unit via wheelchair.  

## 2016-01-03 NOTE — Discharge Summary (Signed)
[facility] - [place] [facility]   PATIENT NAME: [name]    MR#:  [id number]  DATE OF BIRTH:  04/14/1961  DATE OF ADMISSION:  01/02/2016 ADMITTING PHYSICIAN: Oralia Manisavid Willis, MD  DATE OF DISCHARGE: 01/03/2016  1:13 PM  PRIMARY CARE PHYSICIAN: Pricilla HolmSHARPE, LESLIE M, MD    ADMISSION DIAGNOSIS:  Orthostatic hypotension [I95.1] Elevated troponin I level [R79.89] Near syncope [R55]  DISCHARGE DIAGNOSIS:  Principal Problem:   Elevated troponin Active Problems:   Weakness   History of DVT (deep vein thrombosis)   SECONDARY DIAGNOSIS:   Past Medical History:  Diagnosis Date  . 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  1. Syncope-this was secondary to orthostasis. -Patient was hydrated IV fluids and his orthostatics have improved. He still has some mild dizziness but it's much improved since admission. -His cardiac markers 3 were negative, he was seen by cardiology who did not recommend any further intervention. He'll follow-up with cardiology as an outpatient for a stress test tomorrow.  2. Elevated troponin-patient's troponins did not trend upwards. -She is clinically asymptomatic with no acute chest pain. He was seen by cardiology and he recommended outpatient stress test which is to be arranged tomorrow.  3. 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   4. OA - cont. Flexeril, Tramadol.    DISCHARGE CONDITIONS:   Stable.   CONSULTS OBTAINED:  Treatment Team:  Laurier NancyShaukat A Khan, MD  DRUG ALLERGIES:  No Known Allergies  DISCHARGE MEDICATIONS:     Medication List    TAKE these medications   cyclobenzaprine 5 MG tablet Commonly known as:  FLEXERIL Take 1 tablet (5 mg total) by mouth 3 (three) times daily  as needed for muscle spasms.   ELIQUIS 5 MG Tabs tablet Generic drug:  apixaban Take 5 mg by mouth 2 (two) times daily.   milk thistle 175 MG tablet Take 175 mg by mouth daily.   traMADol 50 MG tablet Commonly known as:  ULTRAM Take by mouth.         DISCHARGE INSTRUCTIONS:   DIET:  Regular diet  DISCHARGE CONDITION:  Stable  ACTIVITY:  Activity as tolerated  OXYGEN:  Home Oxygen: No.   Oxygen Delivery: room air  DISCHARGE LOCATION:  home   If you experience worsening of your admission symptoms, develop shortness of breath, life threatening emergency, suicidal or homicidal thoughts you must seek medical attention immediately by calling 911 or calling your MD immediately  if symptoms less severe.  You Must read complete instructions/literature along with all the possible adverse reactions/side effects for all the Medicines you take and that have been prescribed to you. Take any new Medicines after you have completely understood and accpet all the possible adverse reactions/side effects.   Please note  You were cared for by a hospitalist during your hospital stay. If you have any questions about your discharge medications or the care you received while you were in the hospital after you are discharged, you can call the unit and asked to speak with the hospitalist on call if the hospitalist that took care of you is not available. Once you are discharged, your primary care physician will handle any further medical issues. Please note that NO REFILLS for any discharge  medications will be authorized once you are discharged, as it is imperative that you return to your primary care physician (or establish a relationship with a primary care physician if you do not have one) for your aftercare needs so that they can reassess your need for medications and monitor your lab values.     Today   No further episodes of syncope. No other acute complaints. follow-up with cardiology as  an outpatient.  VITAL SIGNS:  Blood pressure 136/90, pulse 75, temperature 98.1 F (36.7 C), temperature source Oral, resp. rate 18, height 6\' 2"  (1.88 m), weight 77.6 kg (171 lb), SpO2 100 %.  I/O:   Intake/Output Summary (Last 24 hours) at 01/03/16 1541 Last data filed at 01/03/16 0945  Gross per 24 hour  Intake              723 ml  Output              300 ml  Net              423 ml    PHYSICAL EXAMINATION:  GENERAL:  55 y.o.-year-old patient lying in the bed with no acute distress.  EYES: Pupils equal, round, reactive to light and accommodation. No scleral icterus. Extraocular muscles intact.  HEENT: Head atraumatic, normocephalic. Oropharynx and nasopharynx clear.  NECK:  Supple, no jugular venous distention. No thyroid enlargement, no tenderness.  LUNGS: Normal breath sounds bilaterally, no wheezing, rales,rhonchi. No use of accessory muscles of respiration.  CARDIOVASCULAR: S1, S2 normal. No murmurs, rubs, or gallops.  ABDOMEN: Soft, non-tender, non-distended. Bowel sounds present. No organomegaly or mass.  EXTREMITIES: No pedal edema, cyanosis, or clubbing.  NEUROLOGIC: Cranial nerves II through XII are intact. No focal motor or sensory defecits b/l.  PSYCHIATRIC: The patient is alert and oriented x 3. Good affect.  SKIN: No obvious rash, lesion, or ulcer.   DATA REVIEW:   CBC  Recent Labs Lab 01/03/16 0452  WBC 5.1  HGB 14.6  HCT 42.3  PLT 109*    Chemistries   Recent Labs Lab 01/02/16 1728 01/03/16 0452  NA 135 138  K 4.0 4.0  CL 102 107  CO2 23 25  GLUCOSE 91 112*  BUN 11 11  CREATININE 0.83 0.84  CALCIUM 9.2 8.7*  AST 26  --   ALT 16*  --   ALKPHOS 47  --   BILITOT 0.7  --     Cardiac Enzymes  Recent Labs Lab 01/03/16 0452  TROPONINI <0.03    Microbiology Results  Results for orders placed or performed in visit on 09/19/12  Culture, blood (single)     Status: None   Collection Time: 09/19/12  3:04 PM  Result Value Ref Range Status    Micro Text Report   Final       COMMENT                   NO GROWTH AEROBICALLY/ANAEROBICALLY IN 5 DAYS   ANTIBIOTIC                                                      Culture, blood (single)     Status: None   Collection Time: 09/19/12  4:16 PM  Result Value Ref Range Status   Micro Text Report   Final  COMMENT                   NO GROWTH AEROBICALLY/ANAEROBICALLY IN 5 DAYS   ANTIBIOTIC                                                        RADIOLOGY:  No results found.    Management plans discussed with the patient, family and they are in agreement.  CODE STATUS:     Code Status Orders        Start     Ordered   01/03/16 0007  Full code  Continuous     01/03/16 0006    Code Status History    Date Active Date Inactive Code Status Order ID Comments User Context   01/03/2016 12:06 AM 01/03/2016  1:02 PM Full Code [id number]  Oralia Manis, MD ED      TOTAL TIME TAKING CARE OF THIS PATIENT: 40 minutes.    Houston Siren M.D on 01/03/2016 at 3:41 PM  Between 7am to 6pm - Pager - [phone]  After 6pm go to [email or web address] - Social research officer, government  [employer] [facility]  Office  [phone]  CC: Primary care physician; Pricilla Holm, MD

## 2016-01-03 NOTE — Progress Notes (Signed)
[name] is a 55 y.o. male  [id number]  Primary Cardiologist: Adrian BlackwaterShaukat Nevelyn Mellott Reason for Consultation: Presyncope  HPI: This is a 55 year old white male with a past medical history of DVT presented to the hospital with episode of dizziness and the lightheadedness and one of the troponins the first one showed 0.7 and the rest were normal.. EKG is unremarkable.   Review of Systems: No orthopnea PND or chest pain   Past Medical History:  Diagnosis Date  . DVT (deep venous thrombosis) (HCC)     Medications Prior to Admission  Medication Sig Dispense Refill  . cyclobenzaprine (FLEXERIL) 5 MG tablet Take 1 tablet (5 mg total) by mouth 3 (three) times daily as needed for muscle spasms. 15 tablet 0  . ELIQUIS 5 MG TABS tablet Take 5 mg by mouth 2 (two) times daily.  5  . milk thistle 175 MG tablet Take 175 mg by mouth daily.    . traMADol (ULTRAM) 50 MG tablet Take by mouth.       Marland Kitchen. apixaban  5 mg Oral BID  . sodium chloride flush  3 mL Intravenous Q12H    Infusions:    No Known Allergies  Social History   Social History  . Marital status: Single    Spouse name: N/A  . Number of children: N/A  . Years of education: N/A   Occupational History  . Not on file.   Social History Main Topics  . Smoking status: Current Every Day Smoker    Packs/day: 1.00    Types: Cigarettes  . Smokeless tobacco: Never Used  . Alcohol use Yes     Comment: 2 pints whiskey a week or maybe 2-5 beers a week   . Drug use:     Types: Marijuana  . Sexual activity: Not on file   Other Topics Concern  . Not on file   Social History Narrative  . No narrative on file    Family History  Problem Relation Age of Onset  . Heart attack Father   . Heart attack Brother     PHYSICAL EXAM: Vitals:   01/03/16 0422 01/03/16 0735  BP: (!) 89/56 117/74  Pulse: 90 64  Resp:  18  Temp:       Intake/Output Summary (Last 24 hours) at 01/03/16 0919 Last data filed at 01/03/16 0908  Gross  per 24 hour  Intake              243 ml  Output              300 ml  Net              -57 ml    General:  Well appearing. No respiratory difficulty HEENT: normal Neck: supple. no JVD. Carotids 2+ bilat; no bruits. No lymphadenopathy or thryomegaly appreciated. Cor: PMI nondisplaced. Regular rate & rhythm. No rubs, gallops or murmurs. Lungs: clear Abdomen: soft, nontender, nondistended. No hepatosplenomegaly. No bruits or masses. Good bowel sounds. Extremities: no cyanosis, clubbing, rash, edema Neuro: alert & oriented x 3, cranial nerves grossly intact. moves all 4 extremities w/o difficulty. Affect pleasant.  ECG: Normal sinus rhythm no acute changes  Results for orders placed or performed during the hospital encounter of 01/02/16 (from the past 24 hour(s))  Basic metabolic panel     Status: None   Collection Time: 01/02/16  5:28 PM  Result Value Ref Range   Sodium 135 135 - 145 mmol/L   Potassium  4.0 3.5 - 5.1 mmol/L   Chloride 102 101 - 111 mmol/L   CO2 23 22 - 32 mmol/L   Glucose, Bld 91 65 - 99 mg/dL   BUN 11 6 - 20 mg/dL   Creatinine, Ser 1.61 0.61 - 1.24 mg/dL   Calcium 9.2 8.9 - 09.6 mg/dL   GFR calc non Af Amer >60 >60 mL/min   GFR calc Af Amer >60 >60 mL/min   Anion gap 10 5 - 15  CBC     Status: Abnormal   Collection Time: 01/02/16  5:28 PM  Result Value Ref Range   WBC 6.3 3.8 - 10.6 K/uL   RBC 4.61 4.40 - 5.90 MIL/uL   Hemoglobin 16.2 13.0 - 18.0 g/dL   HCT 04.5 40.9 - 81.1 %   MCV 101.9 (H) 80.0 - 100.0 fL   MCH 35.2 (H) 26.0 - 34.0 pg   MCHC 34.6 32.0 - 36.0 g/dL   RDW 91.4 78.2 - 95.6 %   Platelets 116 (L) 150 - 440 K/uL  Hepatic function panel     Status: Abnormal   Collection Time: 01/02/16  5:28 PM  Result Value Ref Range   Total Protein 7.5 6.5 - 8.1 g/dL   Albumin 4.0 3.5 - 5.0 g/dL   AST 26 15 - 41 U/L   ALT 16 (L) 17 - 63 U/L   Alkaline Phosphatase 47 38 - 126 U/L   Total Bilirubin 0.7 0.3 - 1.2 mg/dL   Bilirubin, Direct 0.2 0.1 - 0.5 mg/dL    Indirect Bilirubin 0.5 0.3 - 0.9 mg/dL  Protime-INR     Status: None   Collection Time: 01/02/16  5:28 PM  Result Value Ref Range   Prothrombin Time 11.6 11.4 - 15.2 seconds   INR 0.85   Troponin I     Status: Abnormal   Collection Time: 01/02/16  5:28 PM  Result Value Ref Range   Troponin I 0.07 (HH) <0.03 ng/mL  Urinalysis complete, with microscopic     Status: Abnormal   Collection Time: 01/03/16 12:09 AM  Result Value Ref Range   Color, Urine YELLOW (A) YELLOW   APPearance CLEAR (A) CLEAR   Glucose, UA NEGATIVE NEGATIVE mg/dL   Bilirubin Urine NEGATIVE NEGATIVE   Ketones, ur NEGATIVE NEGATIVE mg/dL   Specific Gravity, Urine 1.010 1.005 - 1.030   Hgb urine dipstick NEGATIVE NEGATIVE   pH 7.0 5.0 - 8.0   Protein, ur NEGATIVE NEGATIVE mg/dL   Nitrite NEGATIVE NEGATIVE   Leukocytes, UA NEGATIVE NEGATIVE   RBC / HPF 0-5 0 - 5 RBC/hpf   WBC, UA 0-5 0 - 5 WBC/hpf   Bacteria, UA NONE SEEN NONE SEEN   Squamous Epithelial / LPF NONE SEEN NONE SEEN   Mucous PRESENT   Troponin I     Status: None   Collection Time: 01/03/16 12:09 AM  Result Value Ref Range   Troponin I <0.03 <0.03 ng/mL  Troponin I     Status: None   Collection Time: 01/03/16  4:52 AM  Result Value Ref Range   Troponin I <0.03 <0.03 ng/mL  Basic metabolic panel     Status: Abnormal   Collection Time: 01/03/16  4:52 AM  Result Value Ref Range   Sodium 138 135 - 145 mmol/L   Potassium 4.0 3.5 - 5.1 mmol/L   Chloride 107 101 - 111 mmol/L   CO2 25 22 - 32 mmol/L   Glucose, Bld 112 (H) 65 - 99 mg/dL  BUN 11 6 - 20 mg/dL   Creatinine, Ser 1.61 0.61 - 1.24 mg/dL   Calcium 8.7 (L) 8.9 - 10.3 mg/dL   GFR calc non Af Amer >60 >60 mL/min   GFR calc Af Amer >60 >60 mL/min   Anion gap 6 5 - 15  CBC     Status: Abnormal   Collection Time: 01/03/16  4:52 AM  Result Value Ref Range   WBC 5.1 3.8 - 10.6 K/uL   RBC 4.21 (L) 4.40 - 5.90 MIL/uL   Hemoglobin 14.6 13.0 - 18.0 g/dL   HCT 09.6 04.5 - 40.9 %   MCV 100.6  (H) 80.0 - 100.0 fL   MCH 34.7 (H) 26.0 - 34.0 pg   MCHC 34.5 32.0 - 36.0 g/dL   RDW 81.1 91.4 - 78.2 %   Platelets 109 (L) 150 - 440 K/uL   No results found.   ASSESSMENT AND PLAN: Presyncope especially when standing up. EKG is unremarkable the first set of troponin was only mildly elevated and the second and third are negative. Patient can go home with outpatient workup such as stress test and echocardiogram which has been set up. He will have a stress test tomorrow at 8:30 in the morning.  Cherree Conerly A

## 2016-01-03 NOTE — Progress Notes (Signed)
Arrival Method: via stretcher with ED tech Mental Orientation: A&O Telemetry: [id number], verified by marcel turAntonieta Ibaner, RN Skin: scattered bruises, and abrasions bilateral arms. Verified by marcel turner, RN IV: 18g right forearm Pain: none Tubes: none Safety Measures: Safety Fall Prevention Plan has been given, discussed & signed, non skid socks in place, bed alarm activated. 2A Orientation: Patient has been orientated to the room, unit & staff.  Orders have been reviewed & implemented. Will continue to monitor the patient. Call light has been placed within reach.  Eden LatheLexi Miller, RN

## 2016-01-13 ENCOUNTER — Inpatient Hospital Stay: Payer: Medicaid Other | Attending: Oncology

## 2016-01-13 DIAGNOSIS — Z86718 Personal history of other venous thrombosis and embolism: Secondary | ICD-10-CM | POA: Diagnosis present

## 2016-01-13 DIAGNOSIS — I82409 Acute embolism and thrombosis of unspecified deep veins of unspecified lower extremity: Secondary | ICD-10-CM

## 2016-01-13 LAB — CBC WITH DIFFERENTIAL/PLATELET
BASOS PCT: 1 %
Basophils Absolute: 0 10*3/uL (ref 0–0.1)
EOS ABS: 0.1 10*3/uL (ref 0–0.7)
EOS PCT: 1 %
HEMATOCRIT: 43.4 % (ref 40.0–52.0)
Hemoglobin: 15 g/dL (ref 13.0–18.0)
Lymphocytes Relative: 29 %
Lymphs Abs: 1.3 10*3/uL (ref 1.0–3.6)
MCH: 34.4 pg — ABNORMAL HIGH (ref 26.0–34.0)
MCHC: 34.6 g/dL (ref 32.0–36.0)
MCV: 99.6 fL (ref 80.0–100.0)
MONO ABS: 0.2 10*3/uL (ref 0.2–1.0)
MONOS PCT: 5 %
NEUTROS ABS: 2.9 10*3/uL (ref 1.4–6.5)
Neutrophils Relative %: 64 %
PLATELETS: 153 10*3/uL (ref 150–440)
RBC: 4.36 MIL/uL — ABNORMAL LOW (ref 4.40–5.90)
RDW: 13.3 % (ref 11.5–14.5)
WBC: 4.5 10*3/uL (ref 3.8–10.6)

## 2016-01-13 LAB — COMPREHENSIVE METABOLIC PANEL
ALBUMIN: 4 g/dL (ref 3.5–5.0)
ALT: 14 U/L — ABNORMAL LOW (ref 17–63)
ANION GAP: 6 (ref 5–15)
AST: 20 U/L (ref 15–41)
Alkaline Phosphatase: 51 U/L (ref 38–126)
BILIRUBIN TOTAL: 0.8 mg/dL (ref 0.3–1.2)
BUN: 11 mg/dL (ref 6–20)
CO2: 26 mmol/L (ref 22–32)
Calcium: 9.2 mg/dL (ref 8.9–10.3)
Chloride: 102 mmol/L (ref 101–111)
Creatinine, Ser: 0.85 mg/dL (ref 0.61–1.24)
GFR calc Af Amer: >60 mL/min (ref >60)
GFR calc non Af Amer: >60 mL/min (ref >60)
GLUCOSE: 106 mg/dL — AB (ref 65–99)
POTASSIUM: 3.7 mmol/L (ref 3.5–5.1)
Sodium: 134 mmol/L — ABNORMAL LOW (ref 135–145)
TOTAL PROTEIN: 7 g/dL (ref 6.5–8.1)

## 2016-03-28 ENCOUNTER — Other Ambulatory Visit (INDEPENDENT_AMBULATORY_CARE_PROVIDER_SITE_OTHER): Payer: Self-pay | Admitting: Vascular Surgery

## 2016-05-03 ENCOUNTER — Encounter
Admission: RE | Admit: 2016-05-03 | Discharge: 2016-05-03 | Disposition: A | Discharge status: Home / Self Care | Payer: Medicaid Other | Source: Ambulatory Visit | Attending: Surgery | Admitting: Surgery

## 2016-05-03 HISTORY — DX: Inflammatory liver disease, unspecified: K75.9

## 2016-05-03 NOTE — Pre-Procedure Instructions (Signed)
During telephone interview, pt was asked if Dr. Joice LoftsPoggi instructed him as to when to stop Eliquis prior to surgery.  He could not remember.  Instructed pt to call Dr. Binnie RailPoggi's office and find out when to stop Eliquis prior to surgery and when to restart it after surgery, pt agreed to do this.

## 2016-05-03 NOTE — Patient Instructions (Signed)
  Your procedure is scheduled ZO:XWRUEAVWon:Thursday Jan. 4, 2018. Report to Same Day Surgery. To find out your arrival time please call [phone] between 1PM - 3PM on Wednesday 3, 2018.  Remember: Instructions that are not followed completely may result in serious medical risk, up to and including death, or upon the discretion of your surgeon and anesthesiologist your surgery may need to be rescheduled.    _x___ 1. Do not eat food or drink liquids after midnight. No gum chewing or hard candies.     _x___ 2. No Alcohol for 24 hours before or after surgery.   ____ 3. Bring all medications with you on the day of surgery if instructed.    __x__ 4. Notify your doctor if there is any change in your medical condition     (cold, fever, infections).    __x___ 5. No smoking 24 hours prior to surgery.     Do not wear jewelry, make-up, hairpins, clips or nail polish.  Do not wear lotions, powders, or perfumes.   Do not shave 48 hours prior to surgery. Men may shave face and neck.  Do not bring valuables to the hospital.    [facility] is not [facility] for any belongings or valuables.               Contacts, dentures or bridgework may not be worn into surgery.  Leave your suitcase in the car. After surgery it may be brought to your room.  For patients admitted to the hospital, discharge time is determined by your treatment team.   Patients discharged the day of surgery will not be allowed to drive home.    Please read over the following fact sheets that you were given:   [facility]  ____ Take these medicines the morning of surgery with A SIP OF WATER: none   ____ Fleet Enema (as directed)   ____ Use CHG Soap as directed on instruction sheet  ____ Use inhalers on the day of surgery and bring to hospital day of surgery  ____ Stop metformin 2 days prior to surgery    ____ Take 1/2 of usual insulin dose the night before surgery and none on the morning of surgery.    _x___ Pt to ask Dr. Joice LoftsPoggi if and when to stop Eliquis prior to surgery.  _x___ Stop Anti-inflammatories such as Advil, Aleve, Ibuprofen, Motrin, Naproxen,  Naprosyn, Goodies powders or aspirin products. OK to take Tylenol.   ____ Stop supplements until after surgery.    ____ Bring C-Pap to the hospital.

## 2016-05-04 NOTE — Pre-Procedure Instructions (Signed)
Pt called back to let me know that Dr. Binnie RailPoggi's office instructed pt  To stop Eliquis on Sunday night.

## 2016-05-10 ENCOUNTER — Ambulatory Visit: Payer: Medicaid Other | Admitting: Anesthesiology

## 2016-05-10 ENCOUNTER — Ambulatory Visit
Admission: RE | Admit: 2016-05-10 | Discharge: 2016-05-10 | Disposition: A | Discharge status: Home / Self Care | Payer: Medicaid Other | Source: Ambulatory Visit | Attending: Surgery | Admitting: Surgery

## 2016-05-10 ENCOUNTER — Encounter
Admission: RE | Disposition: A | Discharge status: Home / Self Care | Payer: Self-pay | Source: Ambulatory Visit | Attending: Surgery

## 2016-05-10 DIAGNOSIS — I82509 Chronic embolism and thrombosis of unspecified deep veins of unspecified lower extremity: Secondary | ICD-10-CM | POA: Insufficient documentation

## 2016-05-10 DIAGNOSIS — Z7901 Long term (current) use of anticoagulants: Secondary | ICD-10-CM | POA: Insufficient documentation

## 2016-05-10 DIAGNOSIS — M75111 Incomplete rotator cuff tear or rupture of right shoulder, not specified as traumatic: Secondary | ICD-10-CM | POA: Insufficient documentation

## 2016-05-10 DIAGNOSIS — M7541 Impingement syndrome of right shoulder: Secondary | ICD-10-CM | POA: Insufficient documentation

## 2016-05-10 HISTORY — PX: SHOULDER ARTHROSCOPY WITH OPEN ROTATOR CUFF REPAIR: SHX6092

## 2016-05-10 SURGERY — ARTHROSCOPY, SHOULDER WITH REPAIR, ROTATOR CUFF, OPEN
Anesthesia: General | Site: Shoulder | Laterality: Right | Wound class: Clean

## 2016-05-10 MED ORDER — ROPIVACAINE HCL 5 MG/ML IJ SOLN
INTRAMUSCULAR | Status: AC
  Filled 2016-05-10: qty 40

## 2016-05-10 MED ORDER — PHENYLEPHRINE HCL 10 MG/ML IJ SOLN
INTRAMUSCULAR | Status: DC | PRN
  Administered 2016-05-10: 100 ug via INTRAVENOUS
  Administered 2016-05-10: 80 ug via INTRAVENOUS
  Administered 2016-05-10: 40 ug via INTRAVENOUS
  Administered 2016-05-10 (×2): 80 ug via INTRAVENOUS
  Administered 2016-05-10: 100 ug via INTRAVENOUS
  Administered 2016-05-10: 120 ug via INTRAVENOUS

## 2016-05-10 MED ORDER — FENTANYL CITRATE (PF) 100 MCG/2ML IJ SOLN
INTRAMUSCULAR | Status: AC
Start: 2016-05-10 — End: 2016-05-10
  Filled 2016-05-10: qty 2

## 2016-05-10 MED ORDER — OXYCODONE HCL 5 MG/5ML PO SOLN: 5.0000 mg | Freq: Once | ORAL | Status: DC | PRN

## 2016-05-10 MED ORDER — DEXAMETHASONE SODIUM PHOSPHATE 10 MG/ML IJ SOLN
INTRAMUSCULAR | Status: DC | PRN
  Administered 2016-05-10: 10 mg via INTRAVENOUS

## 2016-05-10 MED ORDER — LIDOCAINE 2% (20 MG/ML) 5 ML SYRINGE
INTRAMUSCULAR | Status: AC
  Filled 2016-05-10: qty 5

## 2016-05-10 MED ORDER — LACTATED RINGERS IV SOLN
INTRAVENOUS | Status: DC
  Administered 2016-05-10 (×2): via INTRAVENOUS

## 2016-05-10 MED ORDER — CEFAZOLIN SODIUM-DEXTROSE 2-4 GM/100ML-% IV SOLN
2.0000 g | Freq: Once | INTRAVENOUS | Status: AC
  Administered 2016-05-10: 2 g via INTRAVENOUS

## 2016-05-10 MED ORDER — PROPOFOL 10 MG/ML IV BOLUS
INTRAVENOUS | Status: AC
  Filled 2016-05-10: qty 20

## 2016-05-10 MED ORDER — PROMETHAZINE HCL 25 MG/ML IJ SOLN: 6.2500 mg | INTRAMUSCULAR | Status: DC | PRN

## 2016-05-10 MED ORDER — OXYCODONE HCL 5 MG PO TABS: 5.0000 mg | ORAL_TABLET | Freq: Once | ORAL | Status: DC | PRN

## 2016-05-10 MED ORDER — MEPERIDINE HCL 25 MG/ML IJ SOLN: 6.2500 mg | INTRAMUSCULAR | Status: DC | PRN

## 2016-05-10 MED ORDER — SUCCINYLCHOLINE CHLORIDE 20 MG/ML IJ SOLN
INTRAMUSCULAR | Status: DC | PRN
  Administered 2016-05-10: 100 mg via INTRAVENOUS

## 2016-05-10 MED ORDER — FENTANYL CITRATE (PF) 100 MCG/2ML IJ SOLN: 25.0000 ug | INTRAMUSCULAR | Status: DC | PRN

## 2016-05-10 MED ORDER — DEXAMETHASONE SODIUM PHOSPHATE 10 MG/ML IJ SOLN
INTRAMUSCULAR | Status: AC
  Filled 2016-05-10: qty 1

## 2016-05-10 MED ORDER — EPHEDRINE SULFATE 50 MG/ML IJ SOLN
INTRAMUSCULAR | Status: DC | PRN
  Administered 2016-05-10: 10 mg via INTRAVENOUS

## 2016-05-10 MED ORDER — PROPOFOL 10 MG/ML IV BOLUS
INTRAVENOUS | Status: DC | PRN
  Administered 2016-05-10: 200 mg via INTRAVENOUS

## 2016-05-10 MED ORDER — ONDANSETRON HCL 4 MG/2ML IJ SOLN
INTRAMUSCULAR | Status: DC | PRN
  Administered 2016-05-10: 4 mg via INTRAVENOUS

## 2016-05-10 MED ORDER — LIDOCAINE HCL (CARDIAC) 20 MG/ML IV SOLN
INTRAVENOUS | Status: DC | PRN
  Administered 2016-05-10: 100 mg via INTRAVENOUS

## 2016-05-10 MED ORDER — OXYCODONE HCL 5 MG PO TABS: 5.0000 mg | ORAL_TABLET | ORAL | 0 refills | Status: DC | PRN

## 2016-05-10 MED ORDER — EPINEPHRINE PF 1 MG/ML IJ SOLN
INTRAMUSCULAR | Status: AC
Start: 2016-05-10 — End: 2016-05-10
  Filled 2016-05-10: qty 1

## 2016-05-10 MED ORDER — FENTANYL CITRATE (PF) 100 MCG/2ML IJ SOLN
INTRAMUSCULAR | Status: AC
  Filled 2016-05-10: qty 2

## 2016-05-10 MED ORDER — SUGAMMADEX SODIUM 200 MG/2ML IV SOLN
INTRAVENOUS | Status: DC | PRN
  Administered 2016-05-10: 150 mg via INTRAVENOUS

## 2016-05-10 MED ORDER — BUPIVACAINE-EPINEPHRINE 0.5% -1:200000 IJ SOLN
INTRAMUSCULAR | Status: DC | PRN
  Administered 2016-05-10: 30 mL

## 2016-05-10 MED ORDER — LIDOCAINE HCL (PF) 1 % IJ SOLN
INTRAMUSCULAR | Status: AC
  Filled 2016-05-10: qty 5

## 2016-05-10 MED ORDER — FAMOTIDINE 20 MG PO TABS
20.0000 mg | ORAL_TABLET | Freq: Once | ORAL | Status: AC
  Administered 2016-05-10: 20 mg via ORAL

## 2016-05-10 MED ORDER — FAMOTIDINE 20 MG PO TABS
ORAL_TABLET | ORAL | Status: AC
  Administered 2016-05-10: 20 mg via ORAL
  Filled 2016-05-10: qty 1

## 2016-05-10 MED ORDER — MIDAZOLAM HCL 2 MG/2ML IJ SOLN
INTRAMUSCULAR | Status: AC
  Administered 2016-05-10: 2 mg via INTRAVENOUS
  Filled 2016-05-10: qty 2

## 2016-05-10 MED ORDER — ROCURONIUM BROMIDE 50 MG/5ML IV SOSY
PREFILLED_SYRINGE | INTRAVENOUS | Status: AC
  Filled 2016-05-10: qty 5

## 2016-05-10 MED ORDER — LIDOCAINE HCL (PF) 1 % IJ SOLN
INTRAMUSCULAR | Status: DC | PRN
  Administered 2016-05-10: 3 mL

## 2016-05-10 MED ORDER — PHENYLEPHRINE 40 MCG/ML (10ML) SYRINGE FOR IV PUSH (FOR BLOOD PRESSURE SUPPORT)
PREFILLED_SYRINGE | INTRAVENOUS | Status: AC
  Filled 2016-05-10: qty 10

## 2016-05-10 MED ORDER — FENTANYL CITRATE (PF) 100 MCG/2ML IJ SOLN
INTRAMUSCULAR | Status: DC | PRN
  Administered 2016-05-10: 50 ug via INTRAVENOUS
  Administered 2016-05-10: 100 ug via INTRAVENOUS
  Administered 2016-05-10: 50 ug via INTRAVENOUS

## 2016-05-10 MED ORDER — SUGAMMADEX SODIUM 200 MG/2ML IV SOLN
INTRAVENOUS | Status: AC
  Filled 2016-05-10: qty 2

## 2016-05-10 MED ORDER — EPINEPHRINE PF 1 MG/ML IJ SOLN
INTRAMUSCULAR | Status: AC
  Filled 2016-05-10: qty 1

## 2016-05-10 MED ORDER — EPHEDRINE 5 MG/ML INJ
INTRAVENOUS | Status: AC
  Filled 2016-05-10: qty 10

## 2016-05-10 MED ORDER — EPINEPHRINE PF 1 MG/ML IJ SOLN
INTRAMUSCULAR | Status: DC | PRN
  Administered 2016-05-10: 2 mL

## 2016-05-10 MED ORDER — ONDANSETRON HCL 4 MG/2ML IJ SOLN
INTRAMUSCULAR | Status: AC
  Filled 2016-05-10: qty 2

## 2016-05-10 MED ORDER — MIDAZOLAM HCL 2 MG/2ML IJ SOLN
2.0000 mg | Freq: Once | INTRAMUSCULAR | Status: AC
  Administered 2016-05-10: 2 mg via INTRAVENOUS

## 2016-05-10 MED ORDER — CEFAZOLIN SODIUM-DEXTROSE 2-4 GM/100ML-% IV SOLN
INTRAVENOUS | Status: AC
  Filled 2016-05-10: qty 100

## 2016-05-10 MED ORDER — ROPIVACAINE HCL 5 MG/ML IJ SOLN
INTRAMUSCULAR | Status: DC | PRN
  Administered 2016-05-10: 30 mL via PERINEURAL

## 2016-05-10 MED ORDER — SUCCINYLCHOLINE CHLORIDE 200 MG/10ML IV SOSY
PREFILLED_SYRINGE | INTRAVENOUS | Status: AC
  Filled 2016-05-10: qty 10

## 2016-05-10 MED ORDER — BUPIVACAINE HCL (PF) 0.5 % IJ SOLN
INTRAMUSCULAR | Status: AC
  Filled 2016-05-10: qty 30

## 2016-05-10 SURGICAL SUPPLY — 46 items
ANCHOR JUGGERKNOT WTAP NDL 2.9 (Anchor) ×15 IMPLANT
ANCHOR SUT QUATTRO KNTLS 4.5 (Anchor) ×9 IMPLANT
BIT DRILL JUGRKNT W/NDL BIT2.9 (DRILL) IMPLANT
BLADE FULL RADIUS 3.5 (BLADE) ×3 IMPLANT
BLADE SHAVER 4.5X7 STR FR (MISCELLANEOUS) IMPLANT
BUR ACROMIONIZER 4.0 (BURR) ×3 IMPLANT
BUR BR 5.5 WIDE MOUTH (BURR) IMPLANT
CANNULA SHAVER 8MMX76MM (CANNULA) ×3 IMPLANT
CHLORAPREP W/TINT 26ML (MISCELLANEOUS) ×6 IMPLANT
COVER MAYO STAND STRL (DRAPES) ×3 IMPLANT
DRAPE IMP U-DRAPE 54X76 (DRAPES) ×6 IMPLANT
DRILL JUGGERKNOT W/NDL BIT 2.9 (DRILL)
DRSG OPSITE POSTOP 4X8 (GAUZE/BANDAGES/DRESSINGS) IMPLANT
ELECT REM PT RETURN 9FT ADLT (ELECTROSURGICAL) ×3
ELECTRODE REM PT RTRN 9FT ADLT (ELECTROSURGICAL) ×1 IMPLANT
GAUZE PETRO XEROFOAM 1X8 (MISCELLANEOUS) ×3 IMPLANT
GAUZE SPONGE 4X4 12PLY STRL (GAUZE/BANDAGES/DRESSINGS) ×3 IMPLANT
GLOVE BIO SURGEON STRL SZ7.5 (GLOVE) ×6 IMPLANT
GLOVE BIO SURGEON STRL SZ8 (GLOVE) ×6 IMPLANT
GLOVE BIOGEL PI IND STRL 8 (GLOVE) ×1 IMPLANT
GLOVE BIOGEL PI INDICATOR 8 (GLOVE) ×2
GLOVE INDICATOR 8.0 STRL GRN (GLOVE) ×3 IMPLANT
GOWN STRL REUS W/ TWL LRG LVL3 (GOWN DISPOSABLE) ×2 IMPLANT
GOWN STRL REUS W/ TWL XL LVL3 (GOWN DISPOSABLE) ×1 IMPLANT
GOWN STRL REUS W/TWL LRG LVL3 (GOWN DISPOSABLE) ×4
GOWN STRL REUS W/TWL XL LVL3 (GOWN DISPOSABLE) ×2
GRASPER SUT 15 45D LOW PRO (SUTURE) IMPLANT
IV LACTATED RINGER IRRG 3000ML (IV SOLUTION) ×4
IV LR IRRIG 3000ML ARTHROMATIC (IV SOLUTION) ×2 IMPLANT
MANIFOLD NEPTUNE II (INSTRUMENTS) ×3 IMPLANT
MASK FACE SPIDER DISP (MASK) ×3 IMPLANT
MAT BLUE FLOOR 46X72 FLO (MISCELLANEOUS) ×3 IMPLANT
NEEDLE REVERSE CUT 1/2 CRC (NEEDLE) IMPLANT
PACK ARTHROSCOPY SHOULDER (MISCELLANEOUS) ×3 IMPLANT
SLING ARM LRG DEEP (SOFTGOODS) IMPLANT
SLING ULTRA II LG (MISCELLANEOUS) ×3 IMPLANT
STAPLER SKIN PROX 35W (STAPLE) ×3 IMPLANT
STRAP SAFETY BODY (MISCELLANEOUS) ×3 IMPLANT
SUT ETHIBOND 0 MO6 C/R (SUTURE) ×3 IMPLANT
SUT VIC AB 2-0 CT1 27 (SUTURE) ×4
SUT VIC AB 2-0 CT1 TAPERPNT 27 (SUTURE) ×2 IMPLANT
TAPE MICROFOAM 4IN (TAPE) ×3 IMPLANT
TUBING ARTHRO INFLOW-ONLY STRL (TUBING) ×3 IMPLANT
TUBING CONNECTING 10 (TUBING) ×2 IMPLANT
TUBING CONNECTING 10' (TUBING) ×1
WAND HAND CNTRL MULTIVAC 90 (MISCELLANEOUS) ×3 IMPLANT

## 2016-05-10 NOTE — OR Nursing (Signed)
Patient denies pain on right arm/shoulder/hand. Able to freely move right fingers without tingling. Good circulatory recall in nailbeds. Pulses in right wrist adequate. Left hand was contracted and very tight, difficult to loosen.  Patient states that it will stay that way for a while. Tolerating crackers and drink. Has a ride home but will leave his car here since he drove himself to the hospital. Family will stay with him tonight.

## 2016-05-10 NOTE — Anesthesia Preprocedure Evaluation (Addendum)
Anesthesia Evaluation  Patient identified by MRN, date of birth, ID band Patient awake    Reviewed: Allergy & Precautions, NPO status , Patient's Chart, lab work & pertinent test results  History of Anesthesia Complications Negative for: history of anesthetic complications  Airway Mallampati: II  TM Distance: >3 FB Neck ROM: Full    Dental  (+) Partial Upper   Pulmonary neg sleep apnea, neg COPD, Current Smoker,  Wedge resection of lung mass in 2009   breath sounds clear to auscultation- rhonchi (-) wheezing      Cardiovascular Exercise Tolerance: Good (-) hypertension(-) CAD and (-) Past MI  Rhythm:Regular Rate:Normal - Systolic murmurs and - Diastolic murmurs    Neuro/Psych negative neurological ROS  negative psych ROS   GI/Hepatic negative GI ROS, (+) Hepatitis -, C  Endo/Other  negative endocrine ROSneg diabetes  Renal/GU negative Renal ROS     Musculoskeletal   Abdominal (+) - obese,   Peds  Hematology negative hematology ROS (+)   Anesthesia Other Findings Past Medical History: No date: DVT (deep venous thrombosis) (HCC) No date: Hepatitis     Comment: hep C, planning on treating with medication.   Reproductive/Obstetrics                            Anesthesia Physical Anesthesia Plan  ASA: II  Anesthesia Plan: General   Post-op Pain Management:  Regional for Post-op pain   Induction: Intravenous  Airway Management Planned: Oral ETT  Additional Equipment:   Intra-op Plan:   Post-operative Plan: Extubation in OR  Informed Consent: I have reviewed the patients History and Physical, chart, labs and discussed the procedure including the risks, benefits and alternatives for the proposed anesthesia with the patient or authorized representative who has indicated his/her understanding and acceptance.   Dental advisory given  Plan Discussed with: CRNA and  Anesthesiologist  Anesthesia Plan Comments:         Anesthesia Quick Evaluation

## 2016-05-10 NOTE — H&P (Signed)
Paper H&P to be scanned into permanent record. H&P reviewed. No changes. 

## 2016-05-10 NOTE — Anesthesia Procedure Notes (Signed)
Procedure Name: Intubation Date/Time: 05/10/2016 1:57 PM Performed by: Doreen Salvage Pre-anesthesia Checklist: Patient identified, Patient being monitored, Timeout performed, Emergency Drugs available and Suction available Patient Re-evaluated:Patient Re-evaluated prior to inductionOxygen Delivery Method: Circle system utilized Preoxygenation: Pre-oxygenation with 100% oxygen Intubation Type: IV induction Ventilation: Mask ventilation without difficulty Laryngoscope Size: Mac and 4 Grade View: Grade III Tube type: Oral Tube size: 7.5 mm Number of attempts: 1 Airway Equipment and Method: Stylet Placement Confirmation: ETT inserted through vocal cords under direct vision,  positive ETCO2 and breath sounds checked- equal and bilateral Secured at: 22 cm Tube secured with: Tape Dental Injury: Teeth and Oropharynx as per pre-operative assessment  Difficulty Due To: Difficult Airway- due to anterior larynx

## 2016-05-10 NOTE — Anesthesia Procedure Notes (Signed)
Anesthesia Regional Block:  Interscalene brachial plexus block  Pre-Anesthetic Checklist: ,, timeout performed, Correct Patient, Correct Site, Correct Laterality, Correct Procedure, Correct Position, site marked, Risks and benefits discussed,  Surgical consent,  Pre-op evaluation,  At surgeon's request and post-op pain management  Laterality: Right  Prep: chloraprep       Needles:  Injection technique: Single-shot  Needle Type: Stimiplex     Needle Length: 10cm 10 cm Needle Gauge: 22 and 22 G    Additional Needles:  Procedures: ultrasound guided (picture in chart) Interscalene brachial plexus block Narrative:  Start time: 05/10/2016 11:40 AM End time: 05/10/2016 11:48 AM Injection made incrementally with aspirations every 5 mL.  Performed by: Personally  Anesthesiologist: Adnan Vanvoorhis  Additional Notes: Functioning IV was confirmed and monitors were applied.  A 50mm 22ga Stimuplex needle was used. Sterile prep and drape,hand hygiene and sterile gloves were used.  Negative aspiration and negative test dose prior to incremental administration of local anesthetic. The patient tolerated the procedure well.

## 2016-05-10 NOTE — Addendum Note (Signed)
Addendum  created 05/10/16 2227 by Naomie DeanWilliam K Kephart, MD   Anesthesia Attestations filed

## 2016-05-10 NOTE — Anesthesia Postprocedure Evaluation (Signed)
Anesthesia Post Note  Patient: [name]  Procedure(s) Performed: Procedure(s) (LRB): SHOULDER ARTHROSCOPY WITH DEBRIDEMENT, DECOMPRESSION, AND OPEN ROTATOR CUFF REPAIR (Right)  Patient location during evaluation: PACU Anesthesia Type: General Level of consciousness: awake and alert Pain management: pain level controlled Vital Signs Assessment: post-procedure vital signs reviewed and stable Respiratory status: spontaneous breathing, nonlabored ventilation, respiratory function stable and patient connected to nasal cannula oxygen Cardiovascular status: blood pressure returned to baseline and stable Postop Assessment: no signs of nausea or vomiting Anesthetic complications: no     Last Vitals:  Vitals:   05/10/16 1644 05/10/16 1735  BP: 124/81 128/84  Pulse: 82 85  Resp: 16 15  Temp: 36.3 C     Last Pain:  Vitals:   05/10/16 1735  TempSrc:   PainSc: 0-No pain                 Ezme Duch S

## 2016-05-10 NOTE — Discharge Instructions (Addendum)
Keep dressing dry and intact.  May shower after dressing changed on post-op day #4 (Monday).  Cover staples with Band-Aids after drying off. Apply ice frequently to shoulder. Take oxycodone as prescribed when needed.  May supplement with ES Tylenol if necessary. Keep shoulder immobilizer on at all times except may remove for bathing purposes. Resume Eliquis tomorrow morning. Follow-up in 10-14 days or as scheduled.  AMBULATORY SURGERY  DISCHARGE INSTRUCTIONS   1) The drugs that you were given will stay in your system until tomorrow so for the next 24 hours you should not:  A) Drive an automobile B) Make any legal decisions C) Drink any alcoholic beverage   2) You may resume regular meals tomorrow.  Today it is better to start with liquids and gradually work up to solid foods.  You may eat anything you prefer, but it is better to start with liquids, then soup and crackers, and gradually work up to solid foods.   3) Please notify your doctor immediately if you have any unusual bleeding, trouble breathing, redness and pain at the surgery site, drainage, fever, or pain not relieved by medication.    4) Additional Instructions:Make sure elbow is into the back of the sling. Use the ball on the sling to squeeze and keep circulation moving.   Please contact your physician with any problems or Same Day Surgery at [phone], Monday through Friday 6 am to 4 pm,                                                     or [place] at [facility] Main number at [phone].

## 2016-05-10 NOTE — OR Nursing (Signed)
To PACU for block after interview with Dr Priscella MannPenwarden

## 2016-05-10 NOTE — Op Note (Signed)
05/10/2016  3:37 PM  Patient:   [name]  Pre-Op Diagnosis:   Impingement/tendinopathy with near full-thickness rotator cuff tear, right shoulder.  Postoperative diagnosis: Impingement/tendinopathy with massive rotator cuff tear, right shoulder.  Procedure: Limited arthroscopic debridement, arthroscopic subacromial decompression, and mini-open repair of massive rotator cuff tear, right shoulder.  Anesthesia: General endotracheal with interscalene block placed preoperatively by the anesthesiologist.  Surgeon:   Maryagnes Amos, MD  Assistant:   Horris Latino, PA-C  Findings: As above. There was a tear involving the superior 50% of the subscapularis tendon, the entire supraspinatus tendon, and the entire infraspinatus tendon. The long head of the biceps tendon had chronically torn and retracted. The labrum demonstrated significant fraying anteriorly and anterosuperiorly without frank detachment from the glenoid. Grade 1 chondromalacial changes were noted involving both the glenoid and humerus.  Complications: None  Fluids:   1000 cc  Estimated blood loss: 10 cc  Tourniquet time: None  Drains: None  Closure: Staples   Brief clinical note: The patient is a 56 year old male with a long history of right shoulder pain. The patient's symptoms have progressed despite medications, activity modification, etc. The patient's history and examination are consistent with impingement/tendinopathy with a rotator cuff tear. These findings were confirmed by MRI scan. The patient presents at this time for definitive management of these shoulder symptoms.  Procedure: The patient was brought into the operating room and lain in the supine position. After adequate IV sedation was achieved, the patient underwent placement of an interscalene block by the anesthesiologist. The patient then underwent general endotracheal intubation and anesthesia before being repositioned in the  beach chair position using the beach chair positioner. The right shoulder and upper extremity were prepped with ChloraPrep solution before being draped sterilely. Preoperative antibiotics were administered. A timeout was performed to confirm the proper surgical site before the expected portal sites and incision site were injected with 0.5% Sensorcaine with epinephrine. A posterior portal was created and the glenohumeral joint thoroughly inspected with the findings as described above. An anterior portal was created using an outside-in technique. The labrum and rotator cuff were further probed, again confirming the above-noted findings. The areas of labral fraying were debrided back to stable margins using the full-radius resector, as was the stump of the long head of the biceps tendon. The ArthroCare wand was inserted and used to obtain hemostasis as well as to "anneal" the labrum superiorly and anteriorly. The instruments were removed from the joint after suctioning the excess fluid.  The camera was repositioned through the posterior portal into the subacromial space. A separate lateral portal was created using an outside-in technique. The 3.5 mm full-radius resector was introduced and used to perform a subtotal bursectomy. The ArthroCare wand was then inserted and used to remove the periosteal tissue off the undersurface of the anterior third of the acromion as well as to recess the coracoacromial ligament from its attachment along the anterior and lateral margins of the acromion. The 4.0 mm acromionizing bur was introduced and used to complete the decompression by removing the undersurface of the anterior third of the acromion. The full radius resector was reintroduced to remove any residual bony debris before the ArthroCare wand was reintroduced to obtain hemostasis. The instruments were then removed from the subacromial space after suctioning the excess fluid.  An approximately 4-5 cm incision was made over  the anterolateral aspect of the shoulder beginning at the anterolateral corner of the acromion and extending distally in line with the  bicipital groove. This incision was carried down through the subcutaneous tissues to expose the deltoid fascia. The raphae between the anterior and middle thirds was identified and this plane developed to provide access into the subacromial space. Additional bursal tissues were debrided sharply using Metzenbaum scissors. The rotator cuff tear was readily identified. The margins were debrided sharply with a #15 blade and the exposed greater tuberosity roughened with a rongeur. The tear was repaired using five Biomet 2.9 mm JuggerKnot anchors. These sutures were then brought back laterally and secured using three [place] QuatroLink anchors to create a two-layer closure. An apparent watertight closure was obtained.  The wound was copiously irrigated with sterile saline solution before the deltoid raphae was reapproximated using 2-0 Vicryl interrupted sutures. The subcutaneous tissues were closed in two layers using 2-0 Vicryl interrupted sutures before the skin was closed using staples. The portal sites also were closed using staples. A sterile bulky dressing was applied to the shoulder before the arm was placed into a shoulder immobilizer. The patient was then awakened, extubated, and returned to the recovery room in satisfactory condition after tolerating the procedure well.

## 2016-05-10 NOTE — Transfer of Care (Signed)
Immediate Anesthesia Transfer of Care Note  Patient: [name]  Procedure(s) Performed: Procedure(s): SHOULDER ARTHROSCOPY WITH DEBRIDEMENT, DECOMPRESSION, AND OPEN ROTATOR CUFF REPAIR (Right)  Patient Location: PACU  Anesthesia Type:General  Level of Consciousness: awake, alert  and oriented  Airway & Oxygen Therapy: Patient connected to face mask oxygen  Post-op Assessment: Post -op Vital signs reviewed and stable  Post vital signs: stable  Last Vitals:  Vitals:   05/10/16 1600 05/10/16 1602  BP: 111/79 111/79  Pulse: 78 80  Resp: 14 16  Temp: 36.8 C     Last Pain:  Vitals:   05/10/16 1602  TempSrc: Temporal  PainSc:          Complications: No apparent anesthesia complications

## 2016-05-11 ENCOUNTER — Encounter: Payer: Self-pay | Admitting: Surgery

## 2016-05-14 NOTE — Progress Notes (Signed)
[facility]  [phone]  Fax:([phone]) [place] DOB: 06-25-1960  MR#: [id number]  QZR#:[id number]  Patient Care Team: Lavonne Chick, MD as PCP - General (Family Medicine)  CHIEF COMPLAINT:   1. 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 On long term anticoagulation.   2. Right upper lung mass October 2010, intensely PET positive, with possible small hilar lymph node - CT-guided biopsy negative for malignancy. Wedge resection on 03/04/09 also negative for malignancy.  3. Lost to f/u since July 2012, patient also stopped Coumadin on his own. Diagnosed with recurrent DVT on 10/25/11 (IVC and b/l iliac vein thrombosis) - referred back here by hospitalist for anticoagulation management.  4. Jan 2014 - admitted with b/l LE cellulitis. Venous doppler showed b/l DVT, likely subacute clots. Anticoagulation therefore changed to SQ Lovenox, then changed to oral Xarelto.  INTERVAL HISTORY:  Patient returns to clinic today for routine six-month follow-up. He continues to tolerate Eliquis well without significant side effects. He denies any easy bleeding or bruising. He has no neurologic complaints. He has significant right shoulder pain from recent rotator cuff surgery. He has no chest pain or shortness of breath. He denies any nausea, vomiting, constipation, or diarrhea. He has no lower extremity pain or swelling. He has no urinary complaints. Patient otherwise feels well and offers no further specific complaints.    REVIEW OF SYSTEMS:   Review of Systems  Constitutional: Negative.  Negative for fever and malaise/fatigue.  Respiratory: Negative.  Negative for cough.   Cardiovascular: Negative.  Negative for chest pain and leg swelling.  Gastrointestinal: Negative.  Negative for abdominal pain.  Genitourinary: Negative.     Neurological: Negative.  Negative for weakness.  Psychiatric/Behavioral: Negative.  The patient is not nervous/anxious.     As per HPI. Otherwise, a complete review of systems is negative.   PAST MEDICAL HISTORY: Recurrent deep venous thrombosis, in the left lower extremity about 20+ years ago, right lower extremity about 8 years ago, and bilateral deep vein thrombosis diagnosed October 2010.  Appendectomy.  Tonsillectomy.  Skin graft to left leg and left ankle.  Neck fusion surgery.   FAMILY HISTORY Denies deep vein thrombosis or pulmonary embolism but states that there is strong coronary artery disease history on his father's side. (His father expired at age 68 from myocardial infarction, and a number of his siblings also had coronary artery disease at a younger age. No stroke.) He denies any other history of malignancy or hematological disorders. Younger brother recently died from cancer of unknown origin.  GYNECOLOGIC HISTORY:  No LMP for male patient.     ADVANCED DIRECTIVES:    HEALTH MAINTENANCE: Social History  Substance Use Topics  . Smoking status: Current Every Day Smoker    Packs/day: 1.00    Types: Cigarettes  . Smokeless tobacco: Never Used  . Alcohol use Yes     Comment: 2 pints whiskey a week or maybe 2-5 beers a week       No Known Allergies  Current Outpatient Prescriptions  Medication Sig Dispense Refill  . ELIQUIS 5 MG TABS tablet TAKE 1 TABLET BY MOUTH 2 TIMES DAILY 60 tablet 3  . Glecaprevir-Pibrentasvir 100-40 MG TABS Take by mouth.    . oxyCODONE (ROXICODONE) 5 MG immediate release tablet Take 1-2 tablets (5-10 mg total) by mouth every 4 (four) hours as needed for severe pain. (  Patient not taking: Reported on 05/15/2016) 60 tablet 0  . polyethylene glycol powder (GLYCOLAX/MIRALAX) powder TAKE 255 G BY MOUTH ONCE DAILY FOR 1 DAY. TAKE AS DIRECTED FOR COLONOSCOPY.     No current facility-administered medications  for this visit.     OBJECTIVE: BP 116/83 (BP Location: Left Arm, Patient Position: Sitting)   Pulse 98   Temp 99.2 F (37.3 C) (Tympanic)   Resp 18   Wt 178 lb 3.9 oz (80.8 kg)   SpO2 (!) 18%   BMI 22.88 kg/m    Body mass index is 22.88 kg/m.    ECOG FS:1 - Symptomatic but completely ambulatory  General: Well-developed, well-nourished, no acute distress. Eyes: Pink conjunctiva, anicteric sclera. HEENT: Normocephalic, moist mucous membranes, clear oropharnyx. Lungs: Clear to auscultation bilaterally. Heart: Regular rate and rhythm. No rubs, murmurs, or gallops. Musculoskeletal: No edema, cyanosis, or clubbing. Neuro: Alert, answering all questions appropriately. Cranial nerves grossly intact. Skin: No rashes or petechiae noted. Psych: Normal affect.   LAB RESULTS:  Appointment on 05/15/2016  Component Date Value Ref Range Status  . WBC 05/15/2016 5.5  3.8 - 10.6 K/uL Final  . RBC 05/15/2016 3.84* 4.40 - 5.90 MIL/uL Final  . Hemoglobin 05/15/2016 13.3  13.0 - 18.0 g/dL Final  . HCT 05/15/2016 38.8* 40.0 - 52.0 % Final  . MCV 05/15/2016 100.9* 80.0 - 100.0 fL Final  . MCH 05/15/2016 34.5* 26.0 - 34.0 pg Final  . MCHC 05/15/2016 34.2  32.0 - 36.0 g/dL Final  . RDW 05/15/2016 14.4  11.5 - 14.5 % Final  . Platelets 05/15/2016 141* 150 - 440 K/uL Final  . Neutrophils Relative % 05/15/2016 64  % Final  . Neutro Abs 05/15/2016 3.6  1.4 - 6.5 K/uL Final  . Lymphocytes Relative 05/15/2016 24  % Final  . Lymphs Abs 05/15/2016 1.3  1.0 - 3.6 K/uL Final  . Monocytes Relative 05/15/2016 9  % Final  . Monocytes Absolute 05/15/2016 0.5  0.2 - 1.0 K/uL Final  . Eosinophils Relative 05/15/2016 2  % Final  . Eosinophils Absolute 05/15/2016 0.1  0 - 0.7 K/uL Final  . Basophils Relative 05/15/2016 1  % Final  . Basophils Absolute 05/15/2016 0.0  0 - 0.1 K/uL Final  . Sodium 05/15/2016 136  135 - 145 mmol/L Final  . Potassium 05/15/2016 3.4* 3.5 - 5.1 mmol/L Final  . Chloride 05/15/2016  100* 101 - 111 mmol/L Final  . CO2 05/15/2016 26  22 - 32 mmol/L Final  . Glucose, Bld 05/15/2016 99  65 - 99 mg/dL Final  . BUN 05/15/2016 13  6 - 20 mg/dL Final  . Creatinine, Ser 05/15/2016 0.81  0.61 - 1.24 mg/dL Final  . Calcium 05/15/2016 9.1  8.9 - 10.3 mg/dL Final  . Total Protein 05/15/2016 7.0  6.5 - 8.1 g/dL Final  . Albumin 05/15/2016 3.7  3.5 - 5.0 g/dL Final  . AST 05/15/2016 18  15 - 41 U/L Final  . ALT 05/15/2016 13* 17 - 63 U/L Final  . Alkaline Phosphatase 05/15/2016 44  38 - 126 U/L Final  . Total Bilirubin 05/15/2016 0.7  0.3 - 1.2 mg/dL Final  . GFR calc non Af Amer 05/15/2016 >60  >60 mL/min Final  . GFR calc Af Amer 05/15/2016 >60  >60 mL/min Final   Comment: (NOTE) The eGFR has been calculated using the CKD EPI equation. This calculation has not been validated in all clinical situations. eGFR's persistently <60 mL/min signify possible Chronic Kidney Disease.   Marland Kitchen  Anion gap 05/15/2016 10  5 - 15 Final    STUDIES: No results found.  ASSESSMENT: Recurrent DVT, heterozygous for Prothrombin gene mutation  PLAN:    1. Recurrent DVT, heterozygous for Prothrombin gene mutation: Patient now on lifelong anticoagulation. Patient had stopped Coumadin on his own but was diagnosed with recurrent DVT on 10/2011 (IVC and b/l iliac vein thrombosis), had IVC filter and was placed back on anticoagulation (Coumadin + BID ASA). Repeat venous doppler on 07/10/13 reported persistent bilateral chronic DVT. After lengthy discussion with the patient it was decided upon that no further follow-up is necessary. Have recommended that his primary care physician continue prescribing Eliquis as he will need this lifelong.  2. Right upper lung mass October 2010, intensely PET positive, with possible small hilar lymph node. No malignancy was found on wedge resection. 3. Thrombocytopenia: Mild. Possibly secondary from history of heavy alcohol use. 4. Rotator cuff tear: Continue evaluation and  treatment per orthopedics.   Patient expressed understanding and was in agreement with this plan. He also understands that He can call clinic at any time with any questions, concerns, or complaints.    Lloyd Huger, MD   05/15/2016 12:06 PM

## 2016-05-15 ENCOUNTER — Inpatient Hospital Stay: Payer: Medicaid Other

## 2016-05-15 ENCOUNTER — Inpatient Hospital Stay: Payer: Medicaid Other | Attending: Oncology | Admitting: Oncology

## 2016-05-15 VITALS — BP 116/83 | HR 98 | Temp 99.2°F | Resp 18 | Wt 178.2 lb

## 2016-05-15 DIAGNOSIS — Z8619 Personal history of other infectious and parasitic diseases: Secondary | ICD-10-CM | POA: Diagnosis not present

## 2016-05-15 DIAGNOSIS — Z86718 Personal history of other venous thrombosis and embolism: Secondary | ICD-10-CM | POA: Diagnosis not present

## 2016-05-15 DIAGNOSIS — Z809 Family history of malignant neoplasm, unspecified: Secondary | ICD-10-CM | POA: Insufficient documentation

## 2016-05-15 DIAGNOSIS — F17210 Nicotine dependence, cigarettes, uncomplicated: Secondary | ICD-10-CM | POA: Diagnosis not present

## 2016-05-15 DIAGNOSIS — R591 Generalized enlarged lymph nodes: Secondary | ICD-10-CM | POA: Diagnosis not present

## 2016-05-15 DIAGNOSIS — Z9049 Acquired absence of other specified parts of digestive tract: Secondary | ICD-10-CM | POA: Diagnosis not present

## 2016-05-15 DIAGNOSIS — D6852 Prothrombin gene mutation: Secondary | ICD-10-CM | POA: Diagnosis not present

## 2016-05-15 DIAGNOSIS — M25511 Pain in right shoulder: Secondary | ICD-10-CM | POA: Diagnosis not present

## 2016-05-15 DIAGNOSIS — R918 Other nonspecific abnormal finding of lung field: Secondary | ICD-10-CM | POA: Diagnosis not present

## 2016-05-15 DIAGNOSIS — Z7901 Long term (current) use of anticoagulants: Secondary | ICD-10-CM | POA: Insufficient documentation

## 2016-05-15 DIAGNOSIS — D696 Thrombocytopenia, unspecified: Secondary | ICD-10-CM | POA: Insufficient documentation

## 2016-05-15 DIAGNOSIS — I82409 Acute embolism and thrombosis of unspecified deep veins of unspecified lower extremity: Secondary | ICD-10-CM

## 2016-05-15 LAB — CBC WITH DIFFERENTIAL/PLATELET
BASOS ABS: 0 10*3/uL (ref 0–0.1)
BASOS PCT: 1 %
EOS ABS: 0.1 10*3/uL (ref 0–0.7)
Eosinophils Relative: 2 %
HEMATOCRIT: 38.8 % — AB (ref 40.0–52.0)
HEMOGLOBIN: 13.3 g/dL (ref 13.0–18.0)
Lymphocytes Relative: 24 %
Lymphs Abs: 1.3 10*3/uL (ref 1.0–3.6)
MCH: 34.5 pg — ABNORMAL HIGH (ref 26.0–34.0)
MCHC: 34.2 g/dL (ref 32.0–36.0)
MCV: 100.9 fL — ABNORMAL HIGH (ref 80.0–100.0)
Monocytes Absolute: 0.5 10*3/uL (ref 0.2–1.0)
Monocytes Relative: 9 %
NEUTROS ABS: 3.6 10*3/uL (ref 1.4–6.5)
NEUTROS PCT: 64 %
Platelets: 141 10*3/uL — ABNORMAL LOW (ref 150–440)
RBC: 3.84 MIL/uL — AB (ref 4.40–5.90)
RDW: 14.4 % (ref 11.5–14.5)
WBC: 5.5 10*3/uL (ref 3.8–10.6)

## 2016-05-15 LAB — COMPREHENSIVE METABOLIC PANEL
ALBUMIN: 3.7 g/dL (ref 3.5–5.0)
ALK PHOS: 44 U/L (ref 38–126)
ALT: 13 U/L — ABNORMAL LOW (ref 17–63)
ANION GAP: 10 (ref 5–15)
AST: 18 U/L (ref 15–41)
BILIRUBIN TOTAL: 0.7 mg/dL (ref 0.3–1.2)
BUN: 13 mg/dL (ref 6–20)
CALCIUM: 9.1 mg/dL (ref 8.9–10.3)
CO2: 26 mmol/L (ref 22–32)
Chloride: 100 mmol/L — ABNORMAL LOW (ref 101–111)
Creatinine, Ser: 0.81 mg/dL (ref 0.61–1.24)
GFR calc Af Amer: >60 mL/min (ref >60)
GFR calc non Af Amer: >60 mL/min (ref >60)
GLUCOSE: 99 mg/dL (ref 65–99)
Potassium: 3.4 mmol/L — ABNORMAL LOW (ref 3.5–5.1)
SODIUM: 136 mmol/L (ref 135–145)
Total Protein: 7 g/dL (ref 6.5–8.1)

## 2016-05-15 NOTE — Progress Notes (Signed)
Patient is here for follow up, he is doing ok. He just recently had Rotator cuff surgery jan 4th.

## 2016-06-08 ENCOUNTER — Encounter: Payer: Self-pay | Admitting: *Deleted

## 2016-06-11 ENCOUNTER — Ambulatory Visit
Admission: RE | Admit: 2016-06-11 | Payer: Medicaid Other | Source: Ambulatory Visit | Admitting: Unknown Physician Specialty

## 2016-06-11 ENCOUNTER — Encounter: Admission: RE | Payer: Self-pay | Source: Ambulatory Visit

## 2016-06-11 HISTORY — DX: Personal history of other infectious and parasitic diseases: Z86.19

## 2016-06-11 SURGERY — COLONOSCOPY WITH PROPOFOL
Anesthesia: General

## 2016-06-15 ENCOUNTER — Ambulatory Visit (INDEPENDENT_AMBULATORY_CARE_PROVIDER_SITE_OTHER): Payer: Medicaid Other | Admitting: Vascular Surgery

## 2016-06-15 ENCOUNTER — Encounter (INDEPENDENT_AMBULATORY_CARE_PROVIDER_SITE_OTHER): Payer: Self-pay | Admitting: Vascular Surgery

## 2016-06-15 VITALS — BP 133/89 | HR 87 | Resp 16 | Ht 74.0 in | Wt 171.0 lb

## 2016-06-15 DIAGNOSIS — I87009 Postthrombotic syndrome without complications of unspecified extremity: Secondary | ICD-10-CM | POA: Insufficient documentation

## 2016-06-15 DIAGNOSIS — Z86718 Personal history of other venous thrombosis and embolism: Secondary | ICD-10-CM

## 2016-06-15 NOTE — Assessment & Plan Note (Signed)
On chronic anticoagulation. Tolerating this well. Postphlebitic symptoms have gotten much better over time with compression stockings.

## 2016-06-15 NOTE — Assessment & Plan Note (Signed)
The patient has chronic bilateral lower extremity and caval thrombosis on chronic anticoagulation. On chronic anticoagulation in the form of Eliquis. Tolerating this well. Postphlebitic symptoms have gotten much better over time with compression stockings. Should continue to use these. I will plan to see him back in 1 year.

## 2016-06-15 NOTE — Progress Notes (Signed)
MRN : [id number]  [name] is a 56 y.o. ([birth year]) male who presents with chief complaint of  Chief Complaint  Patient presents with  . Re-evaluation    1 year follow up  .  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 He has done well on chronic anticoagulation now for several years. He had a recent shoulder surgery and when he was off his anticoagulation he says his leg swelled some more. He has gotten back on anticoagulation and has been wearing his compression stockings regularly, and this has controlled his swelling by enlarge. He has no ulceration or infection. The pain is mild.  Current Outpatient Prescriptions  Medication Sig Dispense Refill  . ELIQUIS 5 MG TABS tablet TAKE 1 TABLET BY MOUTH 2 TIMES DAILY 60 tablet 3  . Glecaprevir-Pibrentasvir 100-40 MG TABS Take by mouth.    . polyethylene glycol powder (GLYCOLAX/MIRALAX) powder TAKE 255 G BY MOUTH ONCE DAILY FOR 1 DAY. TAKE AS DIRECTED FOR COLONOSCOPY.     No current facility-administered medications for this visit.     Past Medical History:  Diagnosis Date  . DVT (deep venous thrombosis) ([place])   . Hepatitis    hep C, planning on treating with medication.  Marland Kitchen History of chickenpox     Past Surgical History:  Procedure Laterality Date  . APPENDECTOMY    . IVC FILTER PLACEMENT ([facility])  2009  . LUNG SURGERY Right 2009   resection, old pneumonia scar  . NECK SURGERY  1982   fusion C6-7  . SHOULDER ARTHROSCOPY WITH OPEN ROTATOR CUFF REPAIR Right 05/10/2016   Procedure: SHOULDER ARTHROSCOPY WITH DEBRIDEMENT, DECOMPRESSION, AND OPEN ROTATOR CUFF REPAIR;  Surgeon: Corky Mull, MD;  Location: [facility] ORS;  Service: Orthopedics;  Laterality: Right;  . TONSILLECTOMY      Social History Social History  Substance Use Topics  . Smoking status: Current Every Day Smoker    Packs/day: 1.00    Types: Cigarettes  .  Smokeless tobacco: Never Used  . Alcohol use Yes     Comment: 2 pints whiskey a week or maybe 2-5 beers a week     Family History Family History  Problem Relation Age of Onset  . Heart attack Father   . Heart attack Brother     No Known Allergies   REVIEW OF SYSTEMS (Negative unless checked)  Constitutional: []Weight loss  []Fever  []Chills Cardiac: []Chest pain   []Chest pressure   []Palpitations   []Shortness of breath when laying flat   []Shortness of breath at rest   []Shortness of breath with exertion. Vascular:  []Pain in legs with walking   []Pain in legs at rest   []Pain in legs when laying flat   []Claudication   []Pain in feet when walking  []Pain in feet at rest  []Pain in feet when laying flat   [x]History of DVT   []Phlebitis   [x]Swelling in legs   []Varicose veins   []Non-healing ulcers Pulmonary:   []Uses home oxygen   []Productive cough   []Hemoptysis   []Wheeze  []COPD   []Asthma Neurologic:  []Dizziness  []Blackouts   []Seizures   []History of stroke   []History of TIA  []Aphasia   []Temporary blindness   []Dysphagia   []Weakness or numbness in arms   []Weakness or numbness in legs Musculoskeletal:  []Arthritis   []Joint swelling   []Joint pain   []  Low back pain Hematologic:  []Easy bruising  []Easy bleeding   []Hypercoagulable state   []Anemic   Gastrointestinal:  []Blood in stool   []Vomiting blood  []Gastroesophageal reflux/heartburn   []Abdominal pain Genitourinary:  []Chronic kidney disease   []Difficult urination  []Frequent urination  []Burning with urination   []Hematuria Skin:  []Rashes   []Ulcers   []Wounds Psychological:  []History of anxiety   [] History of major depression.  Physical Examination  BP 133/89 (BP Location: Left Arm)   Pulse 87   Resp 16   Ht 6' 2" (1.88 m)   Wt 171 lb (77.6 kg)   BMI 21.96 kg/m  Gen:  WD/WN, NAD Head: [place]/AT, No temporalis wasting. Ear/Nose/Throat: Hearing grossly intact, nares w/o erythema or drainage, trachea  midline Eyes: Conjunctiva clear. Sclera non-icteric Neck: Supple.  No JVD.  Pulmonary:  Good air movement, no use of accessory muscles.  Cardiac: RRR, normal S1, S2 Vascular:  Vessel Right Left  Radial Palpable Palpable                                   Gastrointestinal: soft, non-tender/non-distended. No guarding/reflex.  Musculoskeletal: M/S 5/5 throughout.  No deformity or atrophy. 1+ bilateral lower extremity edema. Right arm is in a sling after shoulder surgery several weeks ago. Neurologic: Sensation grossly intact in extremities.  Symmetrical.  Speech is fluent.  Psychiatric: Judgment intact, Mood & affect appropriate for pt's clinical situation. Dermatologic: No rashes or ulcers noted.  No cellulitis or open wounds. Lymph : No Cervical, Axillary, or Inguinal lymphadenopathy.      Labs Recent Results (from the past 2160 hour(s))  CBC with Differential/Platelet     Status: Abnormal   Collection Time: 05/15/16 11:25 AM  Result Value Ref Range   WBC 5.5 3.8 - 10.6 K/uL   RBC 3.84 (L) 4.40 - 5.90 MIL/uL   Hemoglobin 13.3 13.0 - 18.0 g/dL   HCT 38.8 (L) 40.0 - 52.0 %   MCV 100.9 (H) 80.0 - 100.0 fL   MCH 34.5 (H) 26.0 - 34.0 pg   MCHC 34.2 32.0 - 36.0 g/dL   RDW 14.4 11.5 - 14.5 %   Platelets 141 (L) 150 - 440 K/uL   Neutrophils Relative % 64 %   Neutro Abs 3.6 1.4 - 6.5 K/uL   Lymphocytes Relative 24 %   Lymphs Abs 1.3 1.0 - 3.6 K/uL   Monocytes Relative 9 %   Monocytes Absolute 0.5 0.2 - 1.0 K/uL   Eosinophils Relative 2 %   Eosinophils Absolute 0.1 0 - 0.7 K/uL   Basophils Relative 1 %   Basophils Absolute 0.0 0 - 0.1 K/uL  Comprehensive metabolic panel     Status: Abnormal   Collection Time: 05/15/16 11:25 AM  Result Value Ref Range   Sodium 136 135 - 145 mmol/L   Potassium 3.4 (L) 3.5 - 5.1 mmol/L   Chloride 100 (L) 101 - 111 mmol/L   CO2 26 22 - 32 mmol/L   Glucose, Bld 99 65 - 99 mg/dL   BUN 13 6 - 20 mg/dL   Creatinine, Ser 0.81 0.61 - 1.24 mg/dL    Calcium 9.1 8.9 - 10.3 mg/dL   Total Protein 7.0 6.5 - 8.1 g/dL   Albumin 3.7 3.5 - 5.0 g/dL   AST 18 15 - 41 U/L   ALT 13 (L) 17 - 63 U/L   Alkaline Phosphatase 44 38 -  126 U/L   Total Bilirubin 0.7 0.3 - 1.2 mg/dL   GFR calc non Af Amer >60 >60 mL/min   GFR calc Af Amer >60 >60 mL/min    Comment: (NOTE) The eGFR has been calculated using the CKD EPI equation. This calculation has not been validated in all clinical situations. eGFR's persistently <60 mL/min signify possible Chronic Kidney Disease.    Anion gap 10 5 - 15    Radiology No results found.   Assessment/Plan  History of DVT (deep vein thrombosis) On chronic anticoagulation. Tolerating this well. Postphlebitic symptoms have gotten much better over time with compression stockings.  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 On chronic anticoagulation in the form of Eliquis. Tolerating this well. Postphlebitic symptoms have gotten much better over time with compression stockings. Should continue to use these. I will plan to see him back in 1 year.    Leotis Pain, MD  06/15/2016 3:58 PM    This note was created with Dragon medical transcription system.  Any errors from dictation are purely unintentional

## 2016-12-06 ENCOUNTER — Emergency Department: Payer: Medicaid Other

## 2016-12-06 ENCOUNTER — Encounter: Payer: Self-pay | Admitting: Emergency Medicine

## 2016-12-06 ENCOUNTER — Emergency Department
Admission: EM | Admit: 2016-12-06 | Discharge: 2016-12-06 | Disposition: A | Discharge status: Home / Self Care | Payer: Medicaid Other | Attending: Emergency Medicine | Admitting: Emergency Medicine

## 2016-12-06 DIAGNOSIS — N50811 Right testicular pain: Secondary | ICD-10-CM | POA: Diagnosis present

## 2016-12-06 DIAGNOSIS — I861 Scrotal varices: Secondary | ICD-10-CM | POA: Diagnosis not present

## 2016-12-06 DIAGNOSIS — F17210 Nicotine dependence, cigarettes, uncomplicated: Secondary | ICD-10-CM | POA: Insufficient documentation

## 2016-12-06 DIAGNOSIS — Z79899 Other long term (current) drug therapy: Secondary | ICD-10-CM | POA: Insufficient documentation

## 2016-12-06 DIAGNOSIS — Z7901 Long term (current) use of anticoagulants: Secondary | ICD-10-CM | POA: Diagnosis not present

## 2016-12-06 DIAGNOSIS — Z86718 Personal history of other venous thrombosis and embolism: Secondary | ICD-10-CM | POA: Diagnosis not present

## 2016-12-06 DIAGNOSIS — N50819 Testicular pain, unspecified: Secondary | ICD-10-CM

## 2016-12-06 LAB — URINALYSIS, COMPLETE (UACMP) WITH MICROSCOPIC
BILIRUBIN URINE: NEGATIVE
Bacteria, UA: NONE SEEN
GLUCOSE, UA: NEGATIVE mg/dL
HGB URINE DIPSTICK: NEGATIVE
KETONES UR: NEGATIVE mg/dL
Leukocytes, UA: NEGATIVE
Nitrite: NEGATIVE
PROTEIN: NEGATIVE mg/dL
Specific Gravity, Urine: 1.028 (ref 1.005–1.030)
Squamous Epithelial / LPF: NONE SEEN
pH: 5 (ref 5.0–8.0)

## 2016-12-06 LAB — CHLAMYDIA/NGC RT PCR (ARMC ONLY)
Chlamydia Tr: NOT DETECTED
N GONORRHOEAE: NOT DETECTED

## 2016-12-06 MED ORDER — TRAMADOL HCL 50 MG PO TABS: 50.0000 mg | ORAL_TABLET | Freq: Four times a day (QID) | ORAL | 0 refills | Status: DC | PRN

## 2016-12-06 NOTE — ED Notes (Signed)

## 2016-12-06 NOTE — ED Triage Notes (Signed)
Pt c/o right testicular pain that woke him out of sleep this morning.  No fevers. No urinary sx.  Pt does think it is swollen.  Pain is constant.  Ambulatory. Pt appears uncomfortable. Describes as someone kicking him.

## 2016-12-06 NOTE — ED Notes (Signed)
See triage note  states he developed some discomfort in testicle about 1 weeks or so  But woke up with increased pain this morning  Pain is pain on the right  Denies any urinary sx's or discharge

## 2016-12-06 NOTE — ED Provider Notes (Signed)
[facility] Emergency Department Provider Note   ____________________________________________   First MD Initiated Contact with Patient 12/06/16 1257     (approximate)  I have reviewed the triage vital signs and the nursing notes.   HISTORY  Chief Complaint Testicle Pain    HPI [name] is a 56 y.o. male patient presented today of right testicle pain that woke him out of sleep this morning. Patient denies dysuria or urethral discharge. Patient denies any fevers this complaint. Patient state he believes his scrotum this swollen. Patient rates his pain as a 6/10. Patient states felt like he's been kicked. No palliative measures for complaint.   Past Medical History:  Diagnosis Date  . DVT (deep venous thrombosis) ([facility])   . Hepatitis    hep C, planning on treating with medication.  Marland Kitchen History of chickenpox     Patient Active Problem List   Diagnosis Date Noted  . 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 Weakness 01/02/2016  . Elevated troponin 01/02/2016  . History of DVT (deep vein thrombosis) 01/02/2016    Past Surgical History:  Procedure Laterality Date  . APPENDECTOMY    . IVC FILTER PLACEMENT ([facility] HX)  2009  . LUNG SURGERY Right 2009   resection, old pneumonia scar  . NECK SURGERY  1982   fusion C6-7  . SHOULDER ARTHROSCOPY WITH OPEN ROTATOR CUFF REPAIR Right 05/10/2016   Procedure: SHOULDER ARTHROSCOPY WITH DEBRIDEMENT, DECOMPRESSION, AND OPEN ROTATOR CUFF REPAIR;  Surgeon: Christena Flake, MD;  Location: [facility] ORS;  Service: Orthopedics;  Laterality: Right;  . TONSILLECTOMY      Prior to Admission medications   Medication Sig Start Date End Date Taking? Authorizing Provider  ELIQUIS 5 MG TABS tablet TAKE 1 TABLET BY MOUTH 2 TIMES DAILY 04/02/16   Stegmayer, Ranae Plumber, PA-C  Glecaprevir-Pibrentasvir 100-40 MG TABS Take by mouth. 05/09/16   [provider]  polyethylene glycol powder (GLYCOLAX/MIRALAX)  powder TAKE 255 G BY MOUTH ONCE DAILY FOR 1 DAY. TAKE AS DIRECTED FOR COLONOSCOPY. 03/21/16   [provider]  traMADol (ULTRAM) 50 MG tablet Take 1 tablet (50 mg total) by mouth every 6 (six) hours as needed for moderate pain. 12/06/16   Joni Reining, PA-C    Allergies Patient has no known allergies.  Family History  Problem Relation Age of Onset  . Heart attack Father   . Heart attack Brother     Social History Social History  Substance Use Topics  . Smoking status: Current Every Day Smoker    Packs/day: 1.00    Types: Cigarettes  . Smokeless tobacco: Never Used  . Alcohol use Yes     Comment: 2 pints whiskey a week or maybe 2-5 beers a week     Review of Systems  Constitutional: No fever/chills Eyes: No visual changes. ENT: No sore throat. Cardiovascular: Denies chest pain. Respiratory: Denies shortness of breath. Gastrointestinal: No abdominal pain.  No nausea, no vomiting.  No diarrhea.  No constipation. Genitourinary: Negative for dysuria. Right scrotal pain Musculoskeletal: Negative for back pain. Skin: Negative for rash. Neurological: Negative for headaches, focal weakness or numbness.  ____________________________________________   PHYSICAL EXAM:  VITAL SIGNS: ED Triage Vitals  Enc Vitals Group     BP 12/06/16 1048 101/69     Pulse Rate 12/06/16 1048 94     Resp 12/06/16 1048 18     Temp 12/06/16 1048 97.7 F (36.5 C)     Temp Source 12/06/16 1048  Oral     SpO2 12/06/16 1048 99 %     Weight 12/06/16 1048 170 lb (77.1 kg)     Height 12/06/16 1048 6\' 2"  (1.88 m)     Head Circumference --      Peak Flow --      Pain Score 12/06/16 1047 6     Pain Loc --      Pain Edu? --      Excl. in GC? --     Constitutional: Alert and oriented. Well appearing and in no acute distress. Cardiovascular: Normal rate, regular rhythm. Grossly normal heart sounds.  Good peripheral circulation. Respiratory: Normal respiratory effort.  No retractions. Lungs  CTAB. Gastrointestinal: Soft and nontender. No distention. No abdominal bruits. No CVA tenderness. Genitourinary:  No obvious scrotum edema. Patient has moderate guarding palpation on the right scrotum. Musculoskeletal: No lower extremity tenderness nor edema.  No joint effusions. Neurologic:  Normal speech and language. No gross focal neurologic deficits are appreciated. No gait instability. Skin:  Skin is warm, dry and intact. No rash noted. Psychiatric: Mood and affect are normal. Speech and behavior are normal.  ____________________________________________   LABS (all labs ordered are listed, but only abnormal results are displayed)  Labs Reviewed  URINALYSIS, COMPLETE (UACMP) WITH MICROSCOPIC - Abnormal; Notable for the following:       Result Value   Color, Urine AMBER (*)    APPearance HAZY (*)    All other components within normal limits  CHLAMYDIA/NGC RT PCR ([facility] ONLY)   ____________________________________________  EKG   ____________________________________________  RADIOLOGY  [place] Scrotum  Result Date: 12/06/2016 CLINICAL DATA:  Right scrotal region pain for approximately 2 weeks EXAM: SCROTAL ULTRASOUND DOPPLER ULTRASOUND OF THE TESTICLES TECHNIQUE: Complete ultrasound examination of the testicles, epididymis, and other scrotal structures was performed. Color and spectral Doppler ultrasound were also utilized to evaluate blood flow to the testicles. COMPARISON:  None. FINDINGS: Right testicle Measurements: 4.3 x 2.4 x 3.1 cm. No mass or microlithiasis visualized. Left testicle Measurements: 4.2 x 2.0 x 2.9 cm. No mass or microlithiasis visualized. Right epididymis:  Normal in size and appearance. Left epididymis:  Normal in size and appearance. Hydrocele: There is a minimal hydrocele on each side, slightly larger on the left than on the right. Varicocele:  There is a prominent varicocele on the right. Pulsed Doppler interrogation of both testes demonstrates normal  low resistance arterial and venous waveforms bilaterally. No scrotal wall thickening or scrotal abscess on either side. IMPRESSION: Prominent varicocele on the right. Rather minimal hydroceles bilaterally, slightly larger on the left than on the right. No evidence of testicular torsion on either side no inflammatory focus involving either testis or epididymis. There is no intra- testicular or extratesticular mass on either side. Electronically Signed   By: Bretta BangWilliam  Woodruff III M.D.   On: 12/06/2016 11:50   [place] Art/ven Flow Abd Pelv Doppler  Result Date: 12/06/2016 CLINICAL DATA:  Right scrotal region pain for approximately 2 weeks EXAM: SCROTAL ULTRASOUND DOPPLER ULTRASOUND OF THE TESTICLES TECHNIQUE: Complete ultrasound examination of the testicles, epididymis, and other scrotal structures was performed. Color and spectral Doppler ultrasound were also utilized to evaluate blood flow to the testicles. COMPARISON:  None. FINDINGS: Right testicle Measurements: 4.3 x 2.4 x 3.1 cm. No mass or microlithiasis visualized. Left testicle Measurements: 4.2 x 2.0 x 2.9 cm. No mass or microlithiasis visualized. Right epididymis:  Normal in size and appearance. Left epididymis:  Normal in size and appearance. Hydrocele: There is  a minimal hydrocele on each side, slightly larger on the left than on the right. Varicocele:  There is a prominent varicocele on the right. Pulsed Doppler interrogation of both testes demonstrates normal low resistance arterial and venous waveforms bilaterally. No scrotal wall thickening or scrotal abscess on either side. IMPRESSION: Prominent varicocele on the right. Rather minimal hydroceles bilaterally, slightly larger on the left than on the right. No evidence of testicular torsion on either side no inflammatory focus involving either testis or epididymis. There is no intra- testicular or extratesticular mass on either side. Electronically Signed   By: Bretta BangWilliam  Woodruff III M.D.   On: 12/06/2016  11:50    ____________________________________________   PROCEDURES  Procedure(s) performed: None  Procedures  Critical Care performed: No  ____________________________________________   INITIAL IMPRESSION / ASSESSMENT AND PLAN / ED COURSE  Pertinent labs & imaging results that were available during my care of the patient were reviewed by me and considered in my medical decision making (see chart for details).  Patient present with right scrotal pain upon a.m. awakening. Discussed patient negative urinalysis and STD results. Discussed ultrasound findings revealing bilateral Varicocele. Patient given discharge care instructions and advised follow-up with neurologist.  ____________________________________________   FINAL CLINICAL IMPRESSION(S) / ED DIAGNOSES  Final diagnoses:  Testicular pain  Varicocele present on ultrasound of scrotum      NEW MEDICATIONS STARTED DURING THIS VISIT:  Discharge Medication List as of 12/06/2016  1:12 PM    START taking these medications   Details  traMADol (ULTRAM) 50 MG tablet Take 1 tablet (50 mg total) by mouth every 6 (six) hours as needed for moderate pain., Starting Thu 12/06/2016, Print         Note:  This document was prepared using Dragon voice recognition software and may include unintentional dictation errors.    Joni ReiningSmith, Ronald K, PA-C 12/06/16 1321    Don PerkingVeronese, [place], MD 12/07/16 [phone]

## 2016-12-06 NOTE — ED Notes (Signed)
AAOx3.  Skin warm and dry.  NAD 

## 2016-12-15 ENCOUNTER — Emergency Department
Admission: EM | Admit: 2016-12-15 | Discharge: 2016-12-15 | Disposition: A | Discharge status: Home / Self Care | Payer: Medicaid Other | Attending: Emergency Medicine | Admitting: Emergency Medicine

## 2016-12-15 ENCOUNTER — Emergency Department: Payer: Medicaid Other

## 2016-12-15 ENCOUNTER — Encounter: Payer: Self-pay | Admitting: Emergency Medicine

## 2016-12-15 DIAGNOSIS — I861 Scrotal varices: Secondary | ICD-10-CM | POA: Diagnosis not present

## 2016-12-15 DIAGNOSIS — Z79899 Other long term (current) drug therapy: Secondary | ICD-10-CM | POA: Diagnosis not present

## 2016-12-15 DIAGNOSIS — N50811 Right testicular pain: Secondary | ICD-10-CM

## 2016-12-15 DIAGNOSIS — Z86718 Personal history of other venous thrombosis and embolism: Secondary | ICD-10-CM | POA: Insufficient documentation

## 2016-12-15 DIAGNOSIS — F17210 Nicotine dependence, cigarettes, uncomplicated: Secondary | ICD-10-CM | POA: Insufficient documentation

## 2016-12-15 DIAGNOSIS — Z7901 Long term (current) use of anticoagulants: Secondary | ICD-10-CM | POA: Diagnosis not present

## 2016-12-15 MED ORDER — TRAMADOL HCL 50 MG PO TABS
100.0000 mg | ORAL_TABLET | Freq: Once | ORAL | Status: AC
  Administered 2016-12-15: 100 mg via ORAL
  Filled 2016-12-15: qty 2

## 2016-12-15 MED ORDER — TRAMADOL HCL 50 MG PO TABS: 50.0000 mg | ORAL_TABLET | Freq: Four times a day (QID) | ORAL | 0 refills | Status: DC | PRN

## 2016-12-15 NOTE — ED Notes (Signed)
Pt c/o testicular pain for the last week and a half - he has been seen in the ER for this and sent home with pain medication and urology referral - he continues with pain and states he is unable to wait on urology appt for relief and answers to why he is hurting

## 2016-12-15 NOTE — ED Provider Notes (Signed)
[facility] Emergency Department Provider Note  Time seen: 1:05 PM  I have reviewed the triage vital signs and the nursing notes.   HISTORY  Chief Complaint Testicle Pain    HPI [name] is a 56 y.o. male for the past medical history of DVT, hepatitis, presents to the emergency department for right testicular pain. According to the patient for the past 10 days he has been experiencing right testicular pain. Patient was seen in the emergency department approximately one week ago for the same and was diagnosed with a varicocele. Patient states the Ultram helped with the pain but he is now out of this medication. He called urology but they do not take Medicaid. [facility], but the next available appointment is 12/31/16. Patient states the pain is progressively worsened and he could not wait 2 more weeks to be seen by urology suite came to the ER today for evaluation. Denies any abdominal pain, nausea vomiting or diarrhea. Denies dysuria or penile discharge. Largely negative review of systems.  Past Medical History:  Diagnosis Date  . DVT (deep venous thrombosis) ([facility])   . Hepatitis    hep C, planning on treating with medication.  Marland Kitchen. History of chickenpox     Patient Active Problem List   Diagnosis Date Noted  . 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 Weakness 01/02/2016  . Elevated troponin 01/02/2016  . History of DVT (deep vein thrombosis) 01/02/2016    Past Surgical History:  Procedure Laterality Date  . APPENDECTOMY    . IVC FILTER PLACEMENT ([facility] HX)  2009  . LUNG SURGERY Right 2009   resection, old pneumonia scar  . NECK SURGERY  1982   fusion C6-7  . SHOULDER ARTHROSCOPY WITH OPEN ROTATOR CUFF REPAIR Right 05/10/2016   Procedure: SHOULDER ARTHROSCOPY WITH DEBRIDEMENT, DECOMPRESSION, AND OPEN ROTATOR CUFF REPAIR;  Surgeon: Christena FlakeJohn J Poggi, MD;  Location: [facility] ORS;  Service: Orthopedics;  Laterality:  Right;  . TONSILLECTOMY      Prior to Admission medications   Medication Sig Start Date End Date Taking? Authorizing Provider  ELIQUIS 5 MG TABS tablet TAKE 1 TABLET BY MOUTH 2 TIMES DAILY 04/02/16   Stegmayer, Ranae PlumberKimberly A, PA-C  Glecaprevir-Pibrentasvir 100-40 MG TABS Take by mouth. 05/09/16   [provider]  polyethylene glycol powder (GLYCOLAX/MIRALAX) powder TAKE 255 G BY MOUTH ONCE DAILY FOR 1 DAY. TAKE AS DIRECTED FOR COLONOSCOPY. 03/21/16   [provider]  traMADol (ULTRAM) 50 MG tablet Take 1 tablet (50 mg total) by mouth every 6 (six) hours as needed for moderate pain. 12/06/16   Joni ReiningSmith, Ronald K, PA-C    No Known Allergies  Family History  Problem Relation Age of Onset  . Heart attack Father   . Heart attack Brother     Social History Social History  Substance Use Topics  . Smoking status: Current Every Day Smoker    Packs/day: 1.00    Types: Cigarettes  . Smokeless tobacco: Never Used  . Alcohol use Yes     Comment: 2 pints whiskey a week or maybe 2-5 beers a week     Review of Systems Constitutional: Negative for fever. Cardiovascular: Negative for chest pain. Respiratory: Negative for shortness of breath. Gastrointestinal: Negative for abdominal pain, vomiting and diarrhea. Genitourinary: Negative for dysuria.Right testicular pain Musculoskeletal: Negative for back pain. Neurological: Negative for headache All other ROS negative  ____________________________________________   PHYSICAL EXAM:  VITAL SIGNS: ED Triage Vitals  Enc Vitals Group     BP 12/15/16 1214 (!) 139/94     Pulse Rate 12/15/16 1214 (!) 105     Resp 12/15/16 1214 18     Temp 12/15/16 1214 98.6 F (37 C)     Temp Source 12/15/16 1214 Oral     SpO2 12/15/16 1214 98 %     Weight 12/15/16 1214 170 lb (77.1 kg)     Height 12/15/16 1214 6\' 2"  (1.88 m)     Head Circumference --      Peak Flow --      Pain Score 12/15/16 1213 6     Pain Loc --      Pain Edu? --       Excl. in GC? --     Constitutional: Alert and oriented. Well appearing and in no distress. Eyes: Normal exam ENT   Head: Normocephalic and atraumatic.   Mouth/Throat: Mucous membranes are moist. Cardiovascular: Normal rate, regular rhythm. No murmur Respiratory: Normal respiratory effort without tachypnea nor retractions. Breath sounds are clear and equal bilaterally. No wheezes/rales/rhonchi. Gastrointestinal: Soft and nontender. No distention. GU examination shows no edema, moderate right testicular tenderness to palpation especially over the epididymis. No scrotal discoloration. Normal-appearing penis without discharge. Musculoskeletal: Nontender with normal range of motion in all extremities. Neurologic:  Normal speech and language. No gross focal neurologic deficits Skin:  Skin is warm, dry and intact.  Psychiatric: Mood and affect are normal.   ____________________________________________   RADIOLOGY  Ultrasound shows right varicocele  ____________________________________________   INITIAL IMPRESSION / ASSESSMENT AND PLAN / ED COURSE  Pertinent labs & imaging results that were available during my care of the patient were reviewed by me and considered in my medical decision making (see chart for details).  Patient presents to the emergency department for right testicular pain for the past 10 days. Seen for the same one week ago with an ultrasound consistent with a varicocele. Patient has urology appointment 12/31/16. Given the patient's worsening pain we will obtain a repeat ultrasound to rule out torsion. Patient agreeable to plan.   Ultrasound negative for torsion. Positive for right varicocele. We will discharge with urology follow-up. I reviewed the patient's controlled substance database, he does appear to have a history of narcotic use however not since February of this year. We will discharge a short course of  Ultram.  ____________________________________________   FINAL CLINICAL IMPRESSION(S) / ED DIAGNOSES  Right varicocele    Minna Antis, MD 12/15/16 1438

## 2016-12-15 NOTE — ED Triage Notes (Signed)
Pt dx with varicocele by US on Thursday, pt states was sent home with prescription for Tramadol on 8/2. Pt states continued pain, has an app[facility] to [facility] but patient is no long able to stand the pain.

## 2016-12-15 NOTE — ED Notes (Signed)
Paper copy of discharge signed d/t signature pad not working  

## 2016-12-17 ENCOUNTER — Telehealth: Payer: Self-pay

## 2016-12-17 NOTE — Telephone Encounter (Signed)
Pt called stating he has been seen in the ER twice for varicoceles. Pt stated that he is in a lot of pain and wanted to be seen sooner. Pt also stated that he was given 10 tramadol pills at ER on Saturday. Made pt aware there are not open appts for new pt until the 27, which is his appt. Also reinforced with pt since he has not been seen by [facility] nurse can not legally give him any advise, but to contact PCP if any problems until the 27. Pt voiced understanding of whole conversation.

## 2016-12-31 ENCOUNTER — Ambulatory Visit (INDEPENDENT_AMBULATORY_CARE_PROVIDER_SITE_OTHER): Payer: Medicaid Other | Admitting: Urology

## 2016-12-31 ENCOUNTER — Encounter: Payer: Self-pay | Admitting: Urology

## 2016-12-31 VITALS — BP 107/71 | HR 88 | Ht 74.0 in | Wt 172.9 lb

## 2016-12-31 DIAGNOSIS — N5082 Scrotal pain: Secondary | ICD-10-CM | POA: Diagnosis not present

## 2016-12-31 DIAGNOSIS — I861 Scrotal varices: Secondary | ICD-10-CM

## 2016-12-31 LAB — URINALYSIS, COMPLETE
Bilirubin, UA: NEGATIVE
GLUCOSE, UA: NEGATIVE
Ketones, UA: NEGATIVE
Leukocytes, UA: NEGATIVE
NITRITE UA: NEGATIVE
PH UA: 5.5 (ref 5.0–7.5)
RBC, UA: NEGATIVE
Specific Gravity, UA: >1.03 — ABNORMAL HIGH (ref 1.005–1.030)
Urobilinogen, Ur: 0.2 mg/dL (ref 0.2–1.0)

## 2016-12-31 LAB — MICROSCOPIC EXAMINATION
Epithelial Cells (non renal): NONE SEEN /hpf (ref 0–10)
RBC MICROSCOPIC, UA: NONE SEEN /HPF (ref 0–?)
WBC, UA: NONE SEEN /hpf (ref 0–?)

## 2016-12-31 NOTE — Progress Notes (Signed)
12/31/2016 9:22 AM   [name] [birth year] [id number]  Referring provider: Pricilla Holm, MD [address] [place], [place] [address]  Chief Complaint  Patient presents with  . Testicle Pain    HPI: Pt with a h/o right testicle for about a month. Looking back it may have been tender even 2 months ago and getting worse. Moving makes it worse and rest sometimes improves. Intermittent. Feels like an uncomfortable, squeezing pain. He underwent US of the scrotum on 8/2 and 8/11 which revealed normal testicles and a prominent right hydrocele. 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 He does have a history of DVT, hep C and factor II mutation. He saw vascular then and anticoagulation was recommended as he was not thought to be a good candidate for thrombolysis. He's on Eliquis. No LUTS.   Modifying factors: There are no other modifying factors  Associated signs and symptoms: There are no other associated signs and symptoms Aggravating and relieving factors: There are no other aggravating or relieving factors Severity: Moderate Duration: Persistent  His August 2018 UA was clear and his January 2018 BUN/Cr was 13 and 0.81.    PMH: Past Medical History:  Diagnosis Date  . DVT (deep venous thrombosis) (HCC)   . Hepatitis    hep C, planning on treating with medication.  Marland Kitchen History of chickenpox     Surgical History: Past Surgical History:  Procedure Laterality Date  . APPENDECTOMY    . IVC FILTER PLACEMENT ([facility] HX)  2009  . LUNG SURGERY Right 2009   resection, old pneumonia scar  . NECK SURGERY  1982   fusion C6-7  . SHOULDER ARTHROSCOPY WITH OPEN ROTATOR CUFF REPAIR Right 05/10/2016   Procedure: SHOULDER ARTHROSCOPY WITH DEBRIDEMENT, DECOMPRESSION, AND OPEN ROTATOR CUFF REPAIR;  Surgeon: Christena Flake, MD;  Location: [facility] ORS;   Service: Orthopedics;  Laterality: Right;  . TONSILLECTOMY      Home Medications:  Allergies as of 12/31/2016   No Known Allergies     Medication List       Accurate as of 12/31/16  9:22 AM. Always use your most recent med list.          ELIQUIS 5 MG Tabs tablet Generic drug:  apixaban TAKE 1 TABLET BY MOUTH 2 TIMES DAILY   Glecaprevir-Pibrentasvir 100-40 MG Tabs Take by mouth.   polyethylene glycol powder powder Commonly known as:  GLYCOLAX/MIRALAX TAKE 255 G BY MOUTH ONCE DAILY FOR 1 DAY. TAKE AS DIRECTED FOR COLONOSCOPY.   traMADol 50 MG tablet Commonly known as:  ULTRAM Take 1 tablet (50 mg total) by mouth every 6 (six) hours as needed.            Discharge Care Instructions        Start     Ordered   12/31/16 0000  Urinalysis, Complete     12/31/16 0851      Allergies: No Known Allergies  Family History: Family History  Problem Relation Age of Onset  . Heart attack Father   . Heart attack Brother   . Prostate cancer Neg Hx   . Bladder Cancer Neg Hx   . Kidney cancer Neg Hx     Social History:  reports that he has been smoking Cigarettes.  He has been smoking about 1.00 pack per day. He has never used  smokeless tobacco. He reports that he drinks alcohol. He reports that he uses drugs, including Marijuana, about 2 times per week.  ROS: UROLOGY Frequent Urination?: No Hard to postpone urination?: No Burning/pain with urination?: No Get up at night to urinate?: Yes Leakage of urine?: No Urine stream starts and stops?: No Trouble starting stream?: No Do you have to strain to urinate?: No Blood in urine?: No Urinary tract infection?: No Sexually transmitted disease?: No Injury to kidneys or bladder?: No Painful intercourse?: No Weak stream?: No Erection problems?: No Penile pain?: No  Gastrointestinal Nausea?: No Vomiting?: No Indigestion/heartburn?: No Diarrhea?: No Constipation?: No  Constitutional Fever: No Night sweats?:  No Weight loss?: No Fatigue?: No  Skin Skin rash/lesions?: No Itching?: No  Eyes Blurred vision?: No Double vision?: No  Ears/Nose/Throat Sore throat?: No Sinus problems?: No  Hematologic/Lymphatic Swollen glands?: No Easy bruising?: Yes  Cardiovascular Leg swelling?: Yes Chest pain?: No  Respiratory Cough?: No Shortness of breath?: No  Endocrine Excessive thirst?: No  Musculoskeletal Back pain?: Yes Joint pain?: No  Neurological Headaches?: No Dizziness?: No  Psychologic Depression?: No Anxiety?: No  Physical Exam: BP 107/71 (BP Location: Left Arm, Patient Position: Sitting, Cuff Size: Normal)   Pulse 88   Ht 6\' 2"  (1.88 m)   Wt 78.4 kg (172 lb 14.4 oz)   BMI 22.20 kg/m   Constitutional:  Alert and oriented, No acute distress. HEENT: [place] AT, moist mucus membranes.  Trachea midline, no masses. Cardiovascular: No clubbing, cyanosis, or edema. Respiratory: Normal respiratory effort, no increased work of breathing. GI: Abdomen is soft, nontender, nondistended, no abdominal masses; prominent varicose veins under skin along RLQ extending down into right thigh  GU: No CVA tenderness.  Penis: circumcised and normal  Scrotum: Grade III varicocele visible and palpable in right hemiscrotum. Testicles palpably normal. No hernia.  DRE: 30 g prostate, smooth and benign  Skin: No rashes, bruises or suspicious lesions. Lymph: No cervical or inguinal adenopathy. Neurologic: Grossly intact, no focal deficits, moving all 4 extremities. Psychiatric: Normal mood and affect.  Laboratory Data: Lab Results  Component Value Date   WBC 5.5 05/15/2016   HGB 13.3 05/15/2016   HCT 38.8 (L) 05/15/2016   MCV 100.9 (H) 05/15/2016   PLT 141 (L) 05/15/2016    Lab Results  Component Value Date   CREATININE 0.81 05/15/2016    Lab Results  Component Value Date   PSA  12/14/2009    ========== TEST NAME ==========  ========= RESULTS =========  = REFERENCE RANGE  =  PROSTATE-SPECIFIC AG.  PSA, Serum (Serial Monitor) Prostate Specific Ag, Serum     [   0.6 ng/mL            ]           0.0-4.0 Roche ECLIA methodology.                                                                      . According to the American Urological Association, Serum PSA should decrease and remain at undetectable levels after radical prostatectomy. The AUA defines biochemical recurrence as an initial PSA value 0.2 ng/mL or greater followed by a subsequent confirmatory PSA value 0.2 ng/mL or greater. Values obtained with different assay methods  or kits cannot be used interchangeably. Results cannot be interpreted as absolute evidence of the presence or absence of malignant disease.               [facility]            No: [id number]           [address], [place], [place] [address]           Mila Homer, MD         [phone]   Result(s) reported on 15 Dec 2009 at 11:49AM.     No results found for: TESTOSTERONE  No results found for: HGBA1C  Urinalysis    Component Value Date/Time   COLORURINE AMBER (A) 12/06/2016 1052   APPEARANCEUR HAZY (A) 12/06/2016 1052   APPEARANCEUR Turbid 10/25/2011 1452   LABSPEC 1.028 12/06/2016 1052   LABSPEC 1.027 10/25/2011 1452   PHURINE 5.0 12/06/2016 1052   GLUCOSEU NEGATIVE 12/06/2016 1052   GLUCOSEU Negative 10/25/2011 1452   HGBUR NEGATIVE 12/06/2016 1052   BILIRUBINUR NEGATIVE 12/06/2016 1052   BILIRUBINUR Negative 10/25/2011 1452   KETONESUR NEGATIVE 12/06/2016 1052   PROTEINUR NEGATIVE 12/06/2016 1052   NITRITE NEGATIVE 12/06/2016 1052   LEUKOCYTESUR NEGATIVE 12/06/2016 1052   LEUKOCYTESUR Trace 10/25/2011 1452    Pertinent Imaging: CT scan 2013 and [place] x 2 2018   Assessment & Plan:    1. Scrotal pain Possibly from varicocele. Recommend scrotal support.  - Urinalysis, Complete  2. Right varicocele - prominent veins in scrotum and varicose veins along along RLQ and thigh under skin. Not  certain if varicocelectomy would benefit in this situation and coming off Eliquis might be an issue. Will discuss with Dr. Wyn Quaker. I discussed with my colleagues here and concern would be all veins would need to be addressed or collaterals would likely form (would need microsurgical varicocelectomy).    No Follow-up on file.  Jerilee Field, MD  [facility] [address], [address] [place], [place] [address] [phone]

## 2017-01-03 ENCOUNTER — Telehealth: Payer: Self-pay | Admitting: Urology

## 2017-01-03 NOTE — Telephone Encounter (Signed)
Please advise 

## 2017-01-03 NOTE — Telephone Encounter (Signed)
Patient is calling to see if Dr. Mena GoesEskridge has spoken with Dr. Wyn Quakerew yet. He said he is in a lot of pain.  Please reach out tot he patient   Thanks,  [name]

## 2017-01-04 NOTE — Telephone Encounter (Signed)
Yes, I spoke with Dr. Wyn Quakerew and he thought pt could come off anticoagulation for surgery and be low risk for blood clots to the lungs because of the filter. But I am not certain the surgery (varicocele repair) would help his pain. I have put in a call to [facility] one of [facility] U[facility] and am waiting to hear back from them.

## 2017-02-05 ENCOUNTER — Ambulatory Visit (INDEPENDENT_AMBULATORY_CARE_PROVIDER_SITE_OTHER): Payer: Medicaid Other | Admitting: Vascular Surgery

## 2017-02-05 ENCOUNTER — Encounter (INDEPENDENT_AMBULATORY_CARE_PROVIDER_SITE_OTHER): Payer: Self-pay

## 2017-02-05 ENCOUNTER — Encounter (INDEPENDENT_AMBULATORY_CARE_PROVIDER_SITE_OTHER): Payer: Self-pay | Admitting: Vascular Surgery

## 2017-02-05 VITALS — BP 131/85 | HR 87 | Resp 17 | Ht 74.0 in | Wt 170.0 lb

## 2017-02-05 DIAGNOSIS — I861 Scrotal varices: Secondary | ICD-10-CM | POA: Insufficient documentation

## 2017-02-05 DIAGNOSIS — I82523 Chronic embolism and thrombosis of iliac vein, bilateral: Secondary | ICD-10-CM

## 2017-02-05 DIAGNOSIS — R531 Weakness: Secondary | ICD-10-CM | POA: Diagnosis not present

## 2017-02-05 DIAGNOSIS — I82220 Acute embolism and thrombosis of inferior vena cava: Secondary | ICD-10-CM | POA: Insufficient documentation

## 2017-02-05 DIAGNOSIS — I82409 Acute embolism and thrombosis of unspecified deep veins of unspecified lower extremity: Secondary | ICD-10-CM | POA: Insufficient documentation

## 2017-02-05 NOTE — Assessment & Plan Note (Signed)
We had a long discussion today about the difficulties of the situation. There has been some newer data recommending recannulization of chronically occluded inferior vena cava as if possible. I have told him I am happy to try to do this, but I think the likelihood of success is far from assured. This is a long-standing occlusion for many years. If we are able to do this, we may actually remove his IVC filter as well which may help avoid recurrent thrombotic issues. I discussed that this procedure would also still require him to be on anticoagulation indefinitely. He voices his understanding and agrees to proceed with a venogram with possible angioplasty and recanalization and possible filter removal.

## 2017-02-05 NOTE — Progress Notes (Signed)
MRN : [id number]  [name] is a 56 y.o. (15-Jun-1960) male who presents with chief complaint of  Chief Complaint  Patient presents with  . Follow-up    Wants opinion about leg  .  History of Present Illness: Patient returns in follow up for post-phlebitic symptoms with a known extensive DVT history and IVC thrombosis with a filter in place. He has been having more prominent varicosities crossed his abdomen and pelvis area. He has developed a very painful right varicocele. He has postphlebitic symptoms which have been somewhat reasonably well controlled with compression stockings and elevation as well as the lymphedema pump, but these are persistent and long-standing. He has a known extensive DVT history and has been on continuous anticoagulation for years now. He presents to see if there is anything they can be done about his symptoms and describes himself as miserable. His pelvis and scrotum hurt pretty much all the time. The varicosities are becoming larger of his abdomen and his legs remain painful.    Past Medical History:  Diagnosis Date  . DVT (deep venous thrombosis) (HCC)   . Hepatitis    hep C, planning on treating with medication.  Marland Kitchen History of chickenpox     Past Surgical History:  Procedure Laterality Date  . APPENDECTOMY    . IVC FILTER PLACEMENT ([facility] HX)  2009  . LUNG SURGERY Right 2009   resection, old pneumonia scar  . NECK SURGERY  1982   fusion C6-7  . SHOULDER ARTHROSCOPY WITH OPEN ROTATOR CUFF REPAIR Right 05/10/2016   Procedure: SHOULDER ARTHROSCOPY WITH DEBRIDEMENT, DECOMPRESSION, AND OPEN ROTATOR CUFF REPAIR;  Surgeon: Christena Flake, MD;  Location: [facility] ORS;  Service: Orthopedics;  Laterality: Right;  . TONSILLECTOMY      Social History        Social History  Substance Use Topics  . Smoking status: Current Every Day Smoker    Packs/day: 1.00    Types: Cigarettes  . Smokeless tobacco: Never Used  . Alcohol use Yes      Comment: 2 pints  whiskey a week or maybe 2-5 beers a week     Family History      Family History  Problem Relation Age of Onset  . Heart attack Father   . Heart attack Brother   No bleeding disorders, clotting disorders, or aneurysms  No Known Allergies   REVIEW OF SYSTEMS (Negative unless checked)  Constitutional: Weight loss  Fever  Chills Cardiac: Chest pain   Chest pressure   Palpitations   Shortness of breath when laying flat   Shortness of breath at rest   Shortness of breath with exertion. Vascular:  Pain in legs with walking   Pain in legs at rest   Pain in legs when laying flat   Claudication   Pain in feet when walking  Pain in feet at rest  Pain in feet when laying flat   History of DVT   Phlebitis   Swelling in legs   Varicose veins   Non-healing ulcers Pulmonary:   Uses home oxygen   Productive cough   Hemoptysis   Wheeze  COPD   Asthma Neurologic:  Dizziness  Blackouts   Seizures   History of stroke   History of TIA  Aphasia   Temporary blindness   Dysphagia   Weakness or numbness in arms   Weakness or numbness in legs Musculoskeletal:  Arthritis   Joint swelling   Joint pain   Low  back pain Hematologic:  Easy bruising  Easy bleeding   Hypercoagulable state   Anemic   Gastrointestinal:  Blood in stool   Vomiting blood  Gastroesophageal reflux/heartburn   Abdominal pain Genitourinary:  Chronic kidney disease   Difficult urination  Frequent urination  Burning with urination   Hematuria X positive for large, symptomatic varicocele Skin:  Rashes   Ulcers   Wounds Psychological:  History of anxiety    History of major depression.    Physical Examination  Vitals:   02/05/17 1136  BP: 131/85  Pulse: 87  Resp: 17  Weight: 77.1 kg (170 lb)  Height:  (1.88 m)   Body mass index is 21.83 kg/m. Gen:  WD/WN, NAD, appears older than stated age Head: [place]/AT, No  temporalis wasting. Ear/Nose/Throat: Hearing grossly intact, dentition poor, trachea midline Eyes: Conjunctiva clear. Sclera non-icteric Neck: Supple.  No JVD. Trachea midline Pulmonary:  Good air movement, respirations not labored, no use of accessory muscles.  Cardiac: RRR, normal S1, S2. Vascular:  Vessel Right Left  Radial Palpable Palpable                                   Gastrointestinal: soft, non-tender/non-distended. No guarding/reflex.  Musculoskeletal: M/S 5/5 throughout.  No deformity or atrophy. 1-2+ BLE  edema. Neurologic: Sensation grossly intact in extremities.  Symmetrical.  Speech is fluent. Psychiatric: Judgment intact, Mood & affect appropriate for pt's clinical situation. Dermatologic: No rashes or ulcers noted.  Prominent abdominal varicosities present      Labs Recent Results (from the past 2160 hour(s))  Urinalysis, Complete w Microscopic     Status: Abnormal   Collection Time: 12/06/16 10:52 AM  Result Value Ref Range   Color, Urine AMBER (A) YELLOW    Comment: BIOCHEMICALS MAY BE AFFECTED BY COLOR   APPearance HAZY (A) CLEAR   Specific Gravity, Urine 1.028 1.005 - 1.030   pH 5.0 5.0 - 8.0   Glucose, UA NEGATIVE NEGATIVE mg/dL   Hgb urine dipstick NEGATIVE NEGATIVE   Bilirubin Urine NEGATIVE NEGATIVE   Ketones, ur NEGATIVE NEGATIVE mg/dL   Protein, ur NEGATIVE NEGATIVE mg/dL   Nitrite NEGATIVE NEGATIVE   Leukocytes, UA NEGATIVE NEGATIVE   RBC / HPF 0-5 0 - 5 RBC/hpf   WBC, UA 0-5 0 - 5 WBC/hpf   Bacteria, UA NONE SEEN NONE SEEN   Squamous Epithelial / LPF NONE SEEN NONE SEEN   Mucus PRESENT   Chlamydia/NGC rt PCR ([facility] only)     Status: None   Collection Time: 12/06/16 10:52 AM  Result Value Ref Range   Specimen source GC/Chlam URINE, RANDOM    Chlamydia Tr NOT DETECTED NOT DETECTED   N gonorrhoeae NOT DETECTED NOT DETECTED    Comment: (NOTE) 100  This methodology has not been evaluated in pregnant women or in 200  patients with a  history of hysterectomy. 300 400  This methodology will not be performed on patients less than 38  years of age.   Urinalysis, Complete     Status: Abnormal   Collection Time: 12/31/16  8:51 AM  Result Value Ref Range   Specific Gravity, UA >1.030 (H) 1.005 - 1.030   pH, UA 5.5 5.0 - 7.5   Color, UA Yellow Yellow   Appearance Ur Cloudy (A) Clear   Leukocytes, UA Negative Negative   Protein, UA Trace (A) Negative/Trace   Glucose, UA Negative Negative  Ketones, UA Negative Negative   RBC, UA Negative Negative   Bilirubin, UA Negative Negative   Urobilinogen, Ur 0.2 0.2 - 1.0 mg/dL   Nitrite, UA Negative Negative   Microscopic Examination See below:   Microscopic Examination     Status: Abnormal   Collection Time: 12/31/16  8:51 AM  Result Value Ref Range   WBC, UA None seen 0 - 5 /hpf   RBC, UA None seen 0 - 2 /hpf   Epithelial Cells (non renal) None seen 0 - 10 /hpf   Mucus, UA Present (A) Not Estab.   Bacteria, UA Few (A) None seen/Few    Radiology No results found.    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 Urology has discussed repairing the varicocele which is certainly reasonable, but this will be a reasonably high pressure vein and may be more difficult than a typical varicocele. 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  Inferior vena cava occlusion (HCC) We had a long discussion today about the difficulties of the situation. There has been some newer data recommending recannulization of chronically occluded inferior vena cava as if possible. I have told him I am happy to try to do this, but I think the likelihood of  success is far from assured. This is a long-standing occlusion for many years. If we are able to do this, we may actually remove his IVC filter as well which may help avoid recurrent thrombotic issues. I discussed that this procedure would also still require him to be on anticoagulation indefinitely. He voices his understanding and agrees to proceed with a venogram with possible angioplasty and recanalization and possible filter removal.    Festus Barren, MD  02/05/2017 2:51 PM    This note was created with Dragon medical transcription system.  Any errors from dictation are purely unintentional

## 2017-02-05 NOTE — Assessment & Plan Note (Signed)
Almost certainly worsened/created by his IVC and iliac vein occlusion. Urology has discussed repairing the varicocele which is certainly reasonable, but this will be a reasonably high pressure vein and may be more difficult than a typical varicocele. As described below, we have discussed trying to recanalize his inferior vena cava and iliac veins and if that is technically possible, his symptoms would likely significantly improve

## 2017-02-05 NOTE — Assessment & Plan Note (Signed)
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

## 2017-02-05 NOTE — Assessment & Plan Note (Signed)
?  multifactorial 

## 2017-02-05 NOTE — Patient Instructions (Signed)
Deep Vein Thrombosis A deep vein thrombosis (DVT) is a blood clot (thrombus) that usually occurs in a deep, larger vein of the lower leg or the pelvis, or in an upper extremity such as the arm. These are dangerous and can lead to serious and even life-threatening complications if the clot travels to the lungs. A DVT can damage the valves in your leg veins so that instead of flowing upward, the blood pools in the lower leg. This is called post-thrombotic syndrome, and it can result in pain, swelling, discoloration, and sores on the leg. What are the causes? A DVT is caused by the formation of a blood clot in your leg, pelvis, or arm. Usually, several things contribute to the formation of blood clots. A clot may develop when:  Your blood flow slows down.  Your vein becomes damaged in some way.  You have a condition that makes your blood clot more easily.  What increases the risk? A DVT is more likely to develop in:  People who are older, especially over 60 years of age.  People who are overweight (obese).  People who sit or lie still for a long time, such as during long-distance travel (over 4 hours), bed rest, hospitalization, or during recovery from certain medical conditions like a stroke.  People who do not engage in much physical activity (sedentary lifestyle).  People who have chronic breathing disorders.  People who have a personal or family history of blood clots or blood clotting disease.  People who have peripheral vascular disease (PVD), diabetes, or some types of cancer.  People who have heart disease, especially if the person had a recent heart attack or has congestive heart failure.  People who have neurological diseases that affect the legs (leg paresis).  People who have had a traumatic injury, such as breaking a hip or leg.  People who have recently had major or lengthy surgery, especially on the hip, knee, or abdomen.  People who have had a central line placed  inside a large vein.  People who take medicines that contain the hormone estrogen. These include birth control pills and hormone replacement therapy.  Pregnancy or during childbirth or the postpartum period.  Long plane flights (over 8 hours).  What are the signs or symptoms?  Symptoms of a DVT can include:  Swelling of your leg or arm, especially if one side is much worse.  Warmth and redness of your leg or arm, especially if one side is much worse.  Pain in your arm or leg. If the clot is in your leg, symptoms may be more noticeable or worse when you stand or walk.  A feeling of pins and needles, if the clot is in the arm.  The symptoms of a DVT that has traveled to the lungs (pulmonary embolism, PE) usually start suddenly and include:  Shortness of breath while active or at rest.  Coughing or coughing up blood or blood-tinged mucus.  Chest pain that is often worse with deep breaths.  Rapid or irregular heartbeat.  Feeling light-headed or dizzy.  Fainting.  Feeling anxious.  Sweating.  There may also be pain and swelling in a leg if that is where the blood clot started. These symptoms may represent a serious problem that is an emergency. Do not wait to see if the symptoms will go away. Get medical help right away. Call your local emergency services (911 in the [place] Do not drive yourself to the hospital. How is this diagnosed? Your health   care provider will take a medical history and perform a physical exam. You may also have other tests, including:  Blood tests to assess the clotting properties of your blood.  Imaging tests, such as CT, ultrasound, MRI, X-ray, and other tests to see if you have clots anywhere in your body.  How is this treated? After a DVT is identified, it can be treated. The type of treatment that you receive depends on many factors, such as the cause of your DVT, your risk for bleeding or developing more clots, and other medical conditions that  you have. Sometimes, a combination of treatments is necessary. Treatment options may be combined and include:  Monitoring the blood clot with ultrasound.  Taking medicines by mouth, such as newer blood thinners (anticoagulants), thrombolytics, or warfarin.  Taking anticoagulant medicine by injection or through an IV tube.  Wearing compression stockings or using different types ofdevices.  Surgery (rare) to remove the blood clot or to place a filter in your abdomen to stop the blood clot from traveling to your lungs.  Treatments for a DVT are often divided into immediate treatment and long-term treatment (up to 3 months after DVT). You can work with your health care provider to choose the treatment program that is best for you. Follow these instructions at home: If you are taking a newer oral anticoagulant:  Take the medicine every single day at the same time each day.  Understand what foods and drugs interact with this medicine.  Understand that there are no regular blood tests required when using this medicine.  Understand the side effects of this medicine, including excessive bruising or bleeding. Ask your health care provider or pharmacist about other possible side effects. If you are taking warfarin:  Understand how to take warfarin and know which foods can affect how warfarin works in your body.  Understand that it is dangerous to take too much or too little warfarin. Too much warfarin increases the risk of bleeding. Too little warfarin continues to allow the risk for blood clots.  Follow your PT and INR blood testing schedule. The PT and INR results allow your health care provider to adjust your dose of warfarin. It is very important that you have your PT and INR tested as often as told by your health care provider.  Avoid major changes in your diet, or tell your health care provider before you change your diet. Arrange a visit with a registered dietitian to answer your  questions. Many foods, especially foods that are high in vitamin K, can interfere with warfarin and affect the PT and INR results. Eat a consistent amount of foods that are high in vitamin K, such as: ? Spinach, kale, broccoli, cabbage, collard greens, turnip greens, Brussels sprouts, peas, cauliflower, seaweed, and parsley. ? Beef liver and pork liver. ? Green tea. ? Soybean oil.  Tell your health care provider about any and all medicines, vitamins, and supplements that you take, including aspirin and other over-the-counter anti-inflammatory medicines. Be especially cautious with aspirin and anti-inflammatory medicines. Do not take those before you ask your health care provider if it is safe to do so. This is important because many medicines can interfere with warfarin and affect the PT and INR results.  Do not start or stop taking any over-the-counter or prescription medicine unless your health care provider or pharmacist tells you to do so. If you take warfarin, you will also need to do these things:  Hold pressure over cuts for longer than   usual.  Tell your dentist and other health care providers that you are taking warfarin before you have any procedures in which bleeding may occur.  Avoid alcohol or drink very small amounts. Tell your health care provider if you change your alcohol intake.  Do not use tobacco products, including cigarettes, chewing tobacco, and e-cigarettes. If you need help quitting, ask your health care provider.  Avoid contact sports.  General instructions  Take over-the-counter and prescription medicines only as told by your health care provider. Anticoagulant medicines can have side effects, including easy bruising and difficulty stopping bleeding. If you are prescribed an anticoagulant, you will also need to do these things: ? Hold pressure over cuts for longer than usual. ? Tell your dentist and other health care providers that you are taking anticoagulants  before you have any procedures in which bleeding may occur. ? Avoid contact sports.  Wear a medical alert bracelet or carry a medical alert card that says you have had a PE.  Ask your health care provider how soon you can go back to your normal activities. Stay active to prevent new blood clots from forming.  Make sure to exercise while traveling or when you have been sitting or standing for a long period of time. It is very important to exercise. Exercise your legs by walking or by tightening and relaxing your leg muscles often. Take frequent walks.  Wear compression stockings as told by your health care provider to help prevent more blood clots from forming.  Do not use tobacco products, including cigarettes, chewing tobacco, and e-cigarettes. If you need help quitting, ask your health care provider.  Keep all follow-up appointments with your health care provider. This is important. How is this prevented? Take these actions to decrease your risk of developing another DVT:  Exercise regularly. For at least 30 minutes every day, engage in: ? Activity that involves moving your arms and legs. ? Activity that encourages good blood flow through your body by increasing your heart rate.  Exercise your arms and legs every hour during long-distance travel (over 4 hours). Drink plenty of water and avoid drinking alcohol while traveling.  Avoid sitting or lying in bed for long periods of time without moving your legs.  Maintain a weight that is appropriate for your height. Ask your health care provider what weight is healthy for you.  If you are a woman who is over 35 years of age, avoid unnecessary use of medicines that contain estrogen. These include birth control pills.  Do not smoke, especially if you take estrogen medicines. If you need help quitting, ask your health care provider.  If you are hospitalized, prevention measures may include:  Early walking after surgery, as soon as your  health care provider says that it is safe.  Receiving anticoagulants to prevent blood clots.If you cannot take anticoagulants, other options may be available, such as wearing compression stockings or using different types of devices.  Get help right away if:  You have new or increased pain, swelling, or redness in an arm or leg.  You have numbness or tingling in an arm or leg.  You have shortness of breath while active or at rest.  You have chest pain.  You have a rapid or irregular heartbeat.  You feel light-headed or dizzy.  You cough up blood.  You notice blood in your vomit, bowel movement, or urine. These symptoms may represent a serious problem that is an emergency. Do not wait to see   if the symptoms will go away. Get medical help right away. Call your local emergency services (911 in the [place] Do not drive yourself to the hospital. This information is not intended to replace advice given to you by your health care provider. Make sure you discuss any questions you have with your health care provider. Document Released: 04/23/2005 Document Revised: 09/29/2015 Document Reviewed: 08/18/2014 Else[employer] Interactive Patient Education  2017 [employer]  

## 2017-02-07 ENCOUNTER — Other Ambulatory Visit (INDEPENDENT_AMBULATORY_CARE_PROVIDER_SITE_OTHER): Payer: Self-pay | Admitting: Vascular Surgery

## 2017-02-10 MED ORDER — CEFAZOLIN SODIUM-DEXTROSE 2-4 GM/100ML-% IV SOLN
2.0000 g | Freq: Once | INTRAVENOUS | Status: AC
  Administered 2017-02-11: 2 g via INTRAVENOUS

## 2017-02-11 ENCOUNTER — Ambulatory Visit
Admission: RE | Admit: 2017-02-11 | Discharge: 2017-02-11 | Disposition: A | Discharge status: Home / Self Care | Payer: Medicaid Other | Source: Ambulatory Visit | Attending: Vascular Surgery | Admitting: Vascular Surgery

## 2017-02-11 ENCOUNTER — Encounter: Payer: Self-pay | Admitting: Emergency Medicine

## 2017-02-11 ENCOUNTER — Encounter
Admission: RE | Disposition: A | Discharge status: Home / Self Care | Payer: Self-pay | Source: Ambulatory Visit | Attending: Vascular Surgery

## 2017-02-11 DIAGNOSIS — Z8619 Personal history of other infectious and parasitic diseases: Secondary | ICD-10-CM | POA: Diagnosis not present

## 2017-02-11 DIAGNOSIS — F17210 Nicotine dependence, cigarettes, uncomplicated: Secondary | ICD-10-CM | POA: Insufficient documentation

## 2017-02-11 DIAGNOSIS — I82220 Acute embolism and thrombosis of inferior vena cava: Secondary | ICD-10-CM | POA: Insufficient documentation

## 2017-02-11 DIAGNOSIS — I82411 Acute embolism and thrombosis of right femoral vein: Secondary | ICD-10-CM

## 2017-02-11 DIAGNOSIS — I861 Scrotal varices: Secondary | ICD-10-CM | POA: Insufficient documentation

## 2017-02-11 DIAGNOSIS — R531 Weakness: Secondary | ICD-10-CM | POA: Diagnosis not present

## 2017-02-11 DIAGNOSIS — Z981 Arthrodesis status: Secondary | ICD-10-CM | POA: Diagnosis not present

## 2017-02-11 DIAGNOSIS — B1920 Unspecified viral hepatitis C without hepatic coma: Secondary | ICD-10-CM | POA: Diagnosis not present

## 2017-02-11 DIAGNOSIS — Z9889 Other specified postprocedural states: Secondary | ICD-10-CM | POA: Insufficient documentation

## 2017-02-11 DIAGNOSIS — Z8249 Family history of ischemic heart disease and other diseases of the circulatory system: Secondary | ICD-10-CM | POA: Insufficient documentation

## 2017-02-11 DIAGNOSIS — I82523 Chronic embolism and thrombosis of iliac vein, bilateral: Secondary | ICD-10-CM | POA: Diagnosis not present

## 2017-02-11 DIAGNOSIS — I871 Compression of vein: Secondary | ICD-10-CM | POA: Diagnosis not present

## 2017-02-11 HISTORY — PX: IVC FILTER REMOVAL: CATH118246

## 2017-02-11 LAB — CREATININE, SERUM: Creatinine, Ser: 0.99 mg/dL (ref 0.61–1.24)

## 2017-02-11 LAB — BUN: BUN: 17 mg/dL (ref 6–20)

## 2017-02-11 SURGERY — IVC FILTER REMOVAL
Anesthesia: Moderate Sedation

## 2017-02-11 MED ORDER — FENTANYL CITRATE (PF) 100 MCG/2ML IJ SOLN
INTRAMUSCULAR | Status: AC
  Filled 2017-02-11: qty 2

## 2017-02-11 MED ORDER — FAMOTIDINE 20 MG PO TABS
40.0000 mg | ORAL_TABLET | ORAL | Status: DC | PRN
Start: 2017-02-11 — End: 2017-02-11

## 2017-02-11 MED ORDER — LIDOCAINE-EPINEPHRINE (PF) 1 %-1:200000 IJ SOLN
INTRAMUSCULAR | Status: AC
  Filled 2017-02-11: qty 30

## 2017-02-11 MED ORDER — FENTANYL CITRATE (PF) 100 MCG/2ML IJ SOLN
INTRAMUSCULAR | Status: DC | PRN
  Administered 2017-02-11: 25 ug via INTRAVENOUS
  Administered 2017-02-11 (×2): 50 ug via INTRAVENOUS
  Administered 2017-02-11: 25 ug via INTRAVENOUS
  Administered 2017-02-11: 50 ug via INTRAVENOUS

## 2017-02-11 MED ORDER — HEPARIN (PORCINE) IN NACL 2-0.9 UNIT/ML-% IJ SOLN
INTRAMUSCULAR | Status: AC
  Filled 2017-02-11: qty 1000

## 2017-02-11 MED ORDER — HYDROMORPHONE HCL 1 MG/ML IJ SOLN: 1.0000 mg | Freq: Once | INTRAMUSCULAR | Status: DC | PRN

## 2017-02-11 MED ORDER — ONDANSETRON HCL 4 MG/2ML IJ SOLN: 4.0000 mg | Freq: Four times a day (QID) | INTRAMUSCULAR | Status: DC | PRN

## 2017-02-11 MED ORDER — DIPHENHYDRAMINE HCL 50 MG/ML IJ SOLN
INTRAMUSCULAR | Status: AC
  Filled 2017-02-11: qty 1

## 2017-02-11 MED ORDER — SODIUM CHLORIDE 0.9 % IV SOLN
INTRAVENOUS | Status: DC
  Administered 2017-02-11: 12:00:00 via INTRAVENOUS

## 2017-02-11 MED ORDER — MIDAZOLAM HCL 2 MG/2ML IJ SOLN
INTRAMUSCULAR | Status: DC | PRN
  Administered 2017-02-11 (×2): 1 mg via INTRAVENOUS
  Administered 2017-02-11: 2 mg via INTRAVENOUS
  Administered 2017-02-11 (×3): 1 mg via INTRAVENOUS

## 2017-02-11 MED ORDER — MIDAZOLAM HCL 5 MG/5ML IJ SOLN
INTRAMUSCULAR | Status: AC
  Filled 2017-02-11: qty 5

## 2017-02-11 MED ORDER — METHYLPREDNISOLONE SODIUM SUCC 125 MG IJ SOLR: 125.0000 mg | INTRAMUSCULAR | Status: DC | PRN

## 2017-02-11 MED ORDER — DIPHENHYDRAMINE HCL 50 MG/ML IJ SOLN
INTRAMUSCULAR | Status: DC | PRN
Start: 2017-02-11 — End: 2017-02-11
  Administered 2017-02-11: 50 mg via INTRAVENOUS

## 2017-02-11 MED ORDER — HEPARIN SODIUM (PORCINE) 1000 UNIT/ML IJ SOLN
INTRAMUSCULAR | Status: DC | PRN
  Administered 2017-02-11: 2000 [IU] via INTRAVENOUS
  Administered 2017-02-11: 3000 [IU] via INTRAVENOUS

## 2017-02-11 MED ORDER — MIDAZOLAM HCL 2 MG/2ML IJ SOLN
INTRAMUSCULAR | Status: AC
  Filled 2017-02-11: qty 2

## 2017-02-11 MED ORDER — HEPARIN SODIUM (PORCINE) 1000 UNIT/ML IJ SOLN
INTRAMUSCULAR | Status: AC
  Filled 2017-02-11: qty 1

## 2017-02-11 MED ORDER — IOPAMIDOL (ISOVUE-300) INJECTION 61%
INTRAVENOUS | Status: DC | PRN
  Administered 2017-02-11: 55 mL via INTRAVENOUS

## 2017-02-11 SURGICAL SUPPLY — 28 items
BALLN ARMADA 12X80X80 (BALLOONS) ×3
BALLN ARMADA 6X60X80 (BALLOONS) ×3
BALLN DORADO 8X100X80 (BALLOONS) ×3
BALLN ULTRVRSE 6X60X75C (BALLOONS) ×3
BALLOON ARMADA 12X80X80 (BALLOONS) ×1 IMPLANT
BALLOON ARMADA 6X60X80 (BALLOONS) ×1 IMPLANT
BALLOON DORADO 8X100X80 (BALLOONS) ×1 IMPLANT
BALLOON ULTRVRSE 6X60X75C (BALLOONS) ×1 IMPLANT
CANNULA 5F STIFF (CANNULA) ×3 IMPLANT
CATH BEACON 5 .035 65 KMP TIP (CATHETERS) ×3 IMPLANT
CATH CXI 4F 90 DAV (CATHETERS) ×3 IMPLANT
COVER PROBE U/S 5X48 (MISCELLANEOUS) ×3 IMPLANT
DEVICE PRESTO INFLATION (MISCELLANEOUS) ×6 IMPLANT
DEVICE TORQUE (MISCELLANEOUS) ×3 IMPLANT
ENSNARE 9-15 (MISCELLANEOUS) ×3 IMPLANT
GLIDEWIRE STIFF .35X180X3 HYDR (WIRE) ×3 IMPLANT
PACK ANGIOGRAPHY (CUSTOM PROCEDURE TRAY) ×3 IMPLANT
SET VENACAVA FILTER RETRIEVAL (MISCELLANEOUS) ×3 IMPLANT
SHEATH 12FRX45 (SHEATH) ×3 IMPLANT
SHEATH 6FR 45CM BRITE TIP (SHEATH) ×3 IMPLANT
SHEATH BRITE TIP 5FRX11 (SHEATH) ×9 IMPLANT
SHEATH BRITE TIP 6FRX11 (SHEATH) ×3 IMPLANT
STENT LIFESTAR 14X80X80 (Permanent Stent) ×3 IMPLANT
TOWEL OR 17X26 4PK STRL BLUE (TOWEL DISPOSABLE) ×3 IMPLANT
TUBING CONTRAST HIGH PRESS 72 (TUBING) ×3 IMPLANT
WIRE J 3MM .035X145CM (WIRE) ×3 IMPLANT
WIRE MAGIC TOR.035 180C (WIRE) ×3 IMPLANT
WIRE MAGIC TORQUE 260C (WIRE) ×3 IMPLANT

## 2017-02-11 NOTE — Progress Notes (Signed)
Pt sitting up in bed eating and drinking w/o difficulty in NAD.  Discharge and follow up instructions reviewed with pt and family who verbalize understanding.

## 2017-02-11 NOTE — Discharge Instructions (Signed)
Femoral Site Care °Refer to this sheet in the next few weeks. These instructions provide you with information about caring for yourself after your procedure. Your health care provider may also give you more specific instructions. Your treatment has been planned according to current medical practices, but problems sometimes occur. Call your health care provider if you have any problems or questions after your procedure. °What can I expect after the procedure? °After your procedure, it is typical to have the following: °· Bruising at the site that usually fades within 1-2 weeks. °· Blood collecting in the tissue (hematoma) that may be painful to the touch. It should usually decrease in size and tenderness within 1-2 weeks. ° °Follow these instructions at home: °· Take medicines only as directed by your health care provider. °· You may shower 24-48 hours after the procedure or as directed by your health care provider. Remove the bandage (dressing) and gently wash the site with plain soap and water. Pat the area dry with a clean towel. Do not rub the site, because this may cause bleeding. °· Do not take baths, swim, or use a hot tub until your health care provider approves. °· Check your insertion site every day for redness, swelling, or drainage. °· Do not apply powder or lotion to the site. °· Limit use of stairs to twice a day for the first 2-3 days or as directed by your health care provider. °· Do not squat for the first 2-3 days or as directed by your health care provider. °· Do not lift over 10 lb (4.5 kg) for 5 days after your procedure or as directed by your health care provider. °· Ask your health care provider when it is okay to: °? Return to work or school. °? Resume usual physical activities or sports. °? Resume sexual activity. °· Do not drive home if you are discharged the same day as the procedure. Have someone else drive you. °· You may drive 24 hours after the procedure unless otherwise instructed by  your health care provider. °· Do not operate machinery or power tools for 24 hours after the procedure or as directed by your health care provider. °· If your procedure was done as an outpatient procedure, which means that you went home the same day as your procedure, a responsible adult should be with you for the first 24 hours after you arrive home. °· Keep all follow-up visits as directed by your health care provider. This is important. °Contact a health care provider if: °· You have a fever. °· You have chills. °· You have increased bleeding from the site. Hold pressure on the site. °Get help right away if: °· You have unusual pain at the site. °· You have redness, warmth, or swelling at the site. °· You have drainage (other than a small amount of blood on the dressing) from the site. °· The site is bleeding, and the bleeding does not stop after 30 minutes of holding steady pressure on the site. °· Your leg or foot becomes pale, cool, tingly, or numb. °This information is not intended to replace advice given to you by your health care provider. Make sure you discuss any questions you have with your health care provider. °Document Released: 12/25/2013 Document Revised: 09/29/2015 Document Reviewed: 11/10/2013 °[employer] Interactive Patient Education © 2018 [employer] °Moderate Conscious Sedation, Adult, Care After °These instructions provide you with information about caring for yourself after your procedure. Your health care provider may also give you more   specific instructions. Your treatment has been planned according to current medical practices, but problems sometimes occur. Call your health care provider if you have any problems or questions after your procedure. °What can I expect after the procedure? °After your procedure, it is common: °· To feel sleepy for several hours. °· To feel clumsy and have poor balance for several hours. °· To have poor judgment for several hours. °· To vomit if you eat too  soon. ° °Follow these instructions at home: °For at least 24 hours after the procedure: ° °· Do not: °? Participate in activities where you could fall or become injured. °? Drive. °? Use heavy machinery. °? Drink alcohol. °? Take sleeping pills or medicines that cause drowsiness. °? Make important decisions or sign legal documents. °? Take care of children on your own. °· Rest. °Eating and drinking °· Follow the diet recommended by your health care provider. °· If you vomit: °? Drink water, juice, or soup when you can drink without vomiting. °? Make sure you have little or no nausea before eating solid foods. °General instructions °· Have a responsible adult stay with you until you are awake and alert. °· Take over-the-counter and prescription medicines only as told by your health care provider. °· If you smoke, do not smoke without supervision. °· Keep all follow-up visits as told by your health care provider. This is important. °Contact a health care provider if: °· You keep feeling nauseous or you keep vomiting. °· You feel light-headed. °· You develop a rash. °· You have a fever. °Get help right away if: °· You have trouble breathing. °This information is not intended to replace advice given to you by your health care provider. Make sure you discuss any questions you have with your health care provider. °Document Released: 02/11/2013 Document Revised: 09/26/2015 Document Reviewed: 08/13/2015 °[employer] Interactive Patient Education © 2018 [employer] ° °

## 2017-02-11 NOTE — Op Note (Signed)
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 US guidance for vascular access to right femoral vein, left femoral vein and right jugular vein 2. Catheter placement into IVC from right femoral vein approach and catheter placement into IVC from left femoral approach 3.   Catheter placement into right femoral vein from right jugular venous approach 4. IVC gram and bilateral iliofemoral (extremity) venograms 5. Attempted IVC filter removal 6. PTA of the inferior vena cava with 6 and 8 mm balloons 7. PTA of right common and external iliac veins with 12 mm balloon 8.   Self-expanding stent placement to the right common and external iliac veins with 14 mm diameter by 8 cm length stent   SURGEON: Leotis Pain, MD  ASSISTANT(S): none  ANESTHESIA: local with moderate conscious sedation for 90 minutes using 7 mg of Versed and 200 mcg of Fentanyl  ESTIMATED BLOOD LOSS: 5 cc  FINDING(S): 1. Extensive occlusion throughout both iliac veins and the inferior vena cava up to the level of the IVC filter which was in the mid to distal inferior vena cava.  SPECIMEN(S): none  INDICATIONS:  Patient is a 56 y.o. male who presents with chronic iliofemoral venous occlusions as well as a chronically occluded IVC with a filter placed approximately 9 years ago. Patient has marked leg swelling and pain which has been reasonably well controlled with conservative measures for many years. He has now developed extensive venous collaterals as well as a large right varicocele due to the high venous pressure from the collaterals. It was discussed with the patient that there may be nothing that we can do, but if we can recanalize any of celiac vein or vena cava, this may improve his symptoms. Venous intervention is  performed to reduce the symtpoms and avoid long term postphlebitic symptoms. Risks and benefits are discussed and the patient desires to proceed  DESCRIPTION: After obtaining full informed written consent, the patient was brought back to the vascular suite and placed supine upon the table.Moderate conscious sedation was administered during a face to face encounter with the patient throughout the procedure with my supervision of the RN administering medicines and monitoring the patient's vital signs, pulse oximetry, telemetry and mental status throughout from the start of the procedure until the patient was taken to the recovery room. After obtaining adequate anesthesia, the patient was prepped and draped in the standard fashion. The  right femoral vein was then accessed under US guidance and found to be patent at the level of the femoral head, but was found to quickly occluded just at the top of the femoral head. It was accessed without difficulty and a permanent image was recorded.. I then placed a 6 French sheath in the right femoral vein and selective imaging was performed. This showed occlusion of the right iliac vein just above the femoral head with very large collaterals one of which appeared to be a large gonadal vein which would explain his large varicocele. I then used a Kumpe catheter and a CXI catheter with a Glidewire to try to cross the iliac vein occlusion and inferior vena cava occlusion. I was able to get a catheter into the inferior vena cava, but was never able to gain luminal access beyond the occlusion. Selective imaging showed a small amount of contrast pulling around the vena cava and so  I abandoned this approach for the time being. I then accessed the left femoral vein which was also found to be patent at the level of the femoral head but quickly occluded in the distal external iliac vein. A 5 French sheath was placed up the left. Imaging showed less extensive collaterals from the  left but lots of pelvic collaterals were seen. Again the Kumpe catheter and CXI catheter as well as a Glidewire were used to try to cross the left iliac vein and IVC occlusion. Once again, I was able to get into the inferior vena cava but never gain intraluminal access beyond the occlusion. I then elected to come from above. The right jugular vein was visualized with ultrasound and found to be widely patent. It was then accessed under direct ultrasound guidance without difficulty with a Seldinger needle. A J-wire and 5 French sheath were placed. Using the CXI catheter and Glidewire and was able to get down into the inferior vena cava and an inferior venacavogram above the IVC filter showed the vein to be patent from above the IVC filter up. Using the [facility] and then tediously cross the occlusion and got down into the right iliac vein occlusion and cross this down to the right femoral head confirming intraluminal flow in the right femoral vein. I then upsized to a 12 French sheath and the jugular vein. A snare was placed to the right femoral vein was able to snare the Magic torque wire that was in the right femoral vein from the jugular access and pull this out the right femoral sheath. I then placed a catheter into the suprarenal vena cava from the right femoral sheath and up to the right atrium removing the wire from the neck and then placing it in the fashion through the right femoral sheath would be typical with the soft and the body. The diagnostic catheter was then removed. I then tried to remove the IVC filter from the right jugular approach. The [place] retrieval system and an in snare were used but I was never able to snare the hook of the IVC filter as it was embedded on the right wall of the vena cava. I tried to manipulate the filter and I even performed angioplasty in the IVC through the filter in hopes of moving to the side. A 6 mm x 6 cm length balloon was used both to the left and the right of the top  of the filter but attempts at percutaneous retrieval from the jugular vein were unsuccessful. Once we had reached well over 20 minutes of fluoroscopy and this was clearly going to be an unsuccessful endeavor, I looked trying to recanalize his right iliac vein and IVC. The  snare was removed from the jugular approach and I turned my attention to the right femoral sheath. We now had intraluminal access to the IVC from the right femoral sheath now began doing angioplasty. The inferior vena cava was gently angioplastied again with a 6 mm diameter by 6 cm length angioplasty balloon within the location of the IVC filter and then upsized to an 8 mm diameter by 10 cm length angioplasty balloon in the inferior vena cava. This was inflated to 10 atm for 1 minute. This did not disrupt the filter more than it already was. I then used a 12 mm diameter by 8 cm length angioplasty balloon to treat the right common and external iliac veins. 2 inflations to 6 atm for 1 minute were used. This resulted in a  scant channel of blood with residual high-grade stenosis in the iliac vein. I then used a 14 mm diameter by 8 cm length self-expanding stent in the right common and external iliac veins. This was postdilated with a 12 mm balloon with a brisk channel of blood flow and only about a 20% residual stenosis in the iliac vein system. The IVC still had a 40-50% residual stenosis, but there was now an in-line channel blood in the collateral blood flow was not nearly as brisk. I felt we had markedly improved his circulation at this point and without removing the IVC filter going some sort of open caval reconstruction, I'm not sure there was much more we can do today. I then elected to terminate the procedure. The sheath was removed and a dressing was placed in the right jugular location. Both femoral sheaths were then removed with pressure being held and a dressing placed . He was taken to the recovery room in stable condition having  tolerated the procedure well.   COMPLICATIONS: None  CONDITION: Stable  Leotis Pain 02/11/2017 3:36 PM

## 2017-02-11 NOTE — Progress Notes (Signed)
Patient here today for potential ivc filter removal,per Dr Wyn Quaker, attached to monitor and will follow vitals throughout procedure.

## 2017-02-11 NOTE — H&P (Signed)
[facility] History & Physical Update  The patient was interviewed and re-examined.  The patient's previous History and Physical has been reviewed and is unchanged.  There is no change in the plan of care. We plan to proceed with the scheduled procedure.  Festus Barren, MD  02/11/2017, 11:32 AM

## 2017-02-12 ENCOUNTER — Encounter: Payer: Self-pay | Admitting: Vascular Surgery

## 2017-02-12 ENCOUNTER — Telehealth (INDEPENDENT_AMBULATORY_CARE_PROVIDER_SITE_OTHER): Payer: Self-pay

## 2017-02-12 NOTE — Telephone Encounter (Signed)
Spoke with the patient and let him know he would have to come to our office and pick up a prescription for Norco.

## 2017-02-12 NOTE — Telephone Encounter (Signed)
Patient called stating that after he left the hospital yesterday he fell going up the steps to his home and his leg started bleeding, he placed pressure and the bleeding stopped but his leg is hurting and he wants something else for pain because the Tramadol is not touching the pain.

## 2017-03-05 ENCOUNTER — Ambulatory Visit (INDEPENDENT_AMBULATORY_CARE_PROVIDER_SITE_OTHER): Payer: Medicaid Other | Admitting: Vascular Surgery

## 2017-03-05 ENCOUNTER — Encounter (INDEPENDENT_AMBULATORY_CARE_PROVIDER_SITE_OTHER): Payer: Self-pay | Admitting: Vascular Surgery

## 2017-03-05 VITALS — BP 121/86 | HR 99 | Resp 15 | Ht 74.0 in | Wt 172.0 lb

## 2017-03-05 DIAGNOSIS — I82220 Acute embolism and thrombosis of inferior vena cava: Secondary | ICD-10-CM | POA: Diagnosis not present

## 2017-03-05 DIAGNOSIS — I861 Scrotal varices: Secondary | ICD-10-CM | POA: Diagnosis not present

## 2017-03-05 DIAGNOSIS — I87009 Postthrombotic syndrome without complications of unspecified extremity: Secondary | ICD-10-CM | POA: Diagnosis not present

## 2017-03-05 NOTE — Assessment & Plan Note (Signed)
This was almost certainly dramatically worsened by his inferior vena cava and iliac vein occlusion.  This has been treated which should depressurized the varicocele although I would not expect that to cure it.  At this point, if surgical repair is being considered, I think it would be much safer and less bloody than it was before his recent recanalization.  I will defer the decision on whether or not to fix this to the urologist.

## 2017-03-05 NOTE — Progress Notes (Signed)
MRN : [id number]  [name] is a 56 y.o. (27-May-1960) male who presents with chief complaint of  Chief Complaint  Patient presents with  . Follow-up    3 weeks follow up  .  History of Present Illness: Patient returns today in follow up of venous occlusive disease.  He recently underwent an extensive recanalization of his inferior vena cava right iliac vein.  This was done largely for severe venous insufficiency with varicocele which was highly symptomatic as well as superficial varicosities which were large and painful.  These have gotten slightly better since his procedure but probably not as much better as he was hoping.  He had no periprocedural complications.  His legs continue to have some swelling but this is reasonably well maintained.  He denies fever or chills.  He has seen a urologist for potential varicocele repair, but until his venous disease was addressed they were concerned about performing this.  Current Outpatient Prescriptions  Medication Sig Dispense Refill  . ELIQUIS 5 MG TABS tablet TAKE 1 TABLET BY MOUTH 2 TIMES DAILY 60 tablet 3  . Multiple Vitamins-Minerals (MULTIVITAMIN WITH MINERALS) tablet Take 1 tablet by mouth daily.    Marland Kitchen HYDROcodone-acetaminophen (NORCO/VICODIN) 5-325 MG tablet Take by mouth.     No current facility-administered medications for this visit.     Past Medical History:  Diagnosis Date  . DVT (deep venous thrombosis) ([place])   . Hepatitis    hep C, planning on treating with medication.  Marland Kitchen History of chickenpox     Past Surgical History:  Procedure Laterality Date  . APPENDECTOMY    . IVC FILTER PLACEMENT ([place] HX)  2009  . IVC FILTER REMOVAL N/A 02/11/2017   Procedure: IVC FILTER REMOVAL;  Surgeon: Algernon Huxley, MD;  Location: [facility];  Service: Cardiovascular;  Laterality: N/A;  . LUNG SURGERY Right 2009   resection, old pneumonia scar  . NECK SURGERY  1982   fusion C6-7  . SHOULDER ARTHROSCOPY WITH OPEN ROTATOR CUFF  REPAIR Right 05/10/2016   Procedure: SHOULDER ARTHROSCOPY WITH DEBRIDEMENT, DECOMPRESSION, AND OPEN ROTATOR CUFF REPAIR;  Surgeon: Corky Mull, MD;  Location: [facility];  Service: Orthopedics;  Laterality: Right;  . TONSILLECTOMY      Social History  Substance Use Topics  . Smoking status: Current Every Day Smoker    Packs/day: 1.00    Types: Cigarettes  . Smokeless tobacco: Never Used  . Alcohol use Yes      Comment: 2 pints whiskey a week or maybe 2-5 beers a week     Family History      Family History  Problem Relation Age of Onset  . Heart attack Father   . Heart attack Brother   No bleeding disorders, clotting disorders, or aneurysms  No Known Allergies   REVIEW OF SYSTEMS(Negative unless checked)  Constitutional: [] Weight loss[] Fever[] Chills Cardiac:[] Chest pain[] Chest pressure[] Palpitations [] Shortness of breath when laying flat [] Shortness of breath at rest [] Shortness of breath with exertion. Vascular: [] Pain in legs with walking[] Pain in legsat rest[] Pain in legs when laying flat [] Claudication [] Pain in feet when walking [] Pain in feet at rest [] Pain in feet when laying flat [x] History of DVT [] Phlebitis [x] Swelling in legs [] Varicose veins [] Non-healing ulcers Pulmonary: [] Uses home oxygen [] Productive cough[] Hemoptysis [] Wheeze [] COPD [] Asthma Neurologic: [] Dizziness [] Blackouts [] Seizures [] History of stroke [] History of TIA[] Aphasia [] Temporary blindness[] Dysphagia [] Weaknessor numbness in arms [] Weakness or numbnessin legs Musculoskeletal: [] Arthritis [] Joint swelling [] Joint pain [] Low back pain Hematologic:[] Easy bruising[] Easy bleeding [] Hypercoagulable state []   Anemic  Gastrointestinal:[] Blood in stool[] Vomiting blood[] Gastroesophageal reflux/heartburn[] Abdominal pain Genitourinary: [] Chronic kidney disease [] Difficulturination  [] Frequenturination [] Burning with urination[] Hematuria X positive for large, symptomatic varicocele Skin: [] Rashes [] Ulcers [] Wounds Psychological: [] History of anxiety[] History of major depression.    Physical Examination  BP 121/86 (BP Location: Right Arm)   Pulse 99   Resp 15   Ht 6' 2"  (1.88 m)   Wt 78 kg (172 lb)   BMI 22.08 kg/m  Gen:  WD/WN, NAD Head: [place]/AT, No temporalis wasting. Ear/Nose/Throat: Hearing grossly intact, nares w/o erythema or drainage, trachea midline Eyes: Conjunctiva clear. Sclera non-icteric Neck: Supple.  No JVD.  Pulmonary:  Good air movement, no use of accessory muscles.  Cardiac: RRR, normal S1, S2 Vascular:  Vessel Right Left  Radial Palpable Palpable                                   Gastrointestinal: soft, non-tender/non-distended.  Superficial varicosities appear a little less prominent Musculoskeletal: M/S 5/5 throughout.  No deformity or atrophy. 1-2+ BLE edema. Neurologic: Sensation grossly intact in extremities.  Symmetrical.  Speech is fluent.  Psychiatric: Judgment intact, Mood & affect appropriate for pt's clinical situation. Dermatologic: No rashes or ulcers noted.  No cellulitis or open wounds.       Labs Recent Results (from the past 2160 hour(s))  Urinalysis, Complete w Microscopic     Status: Abnormal   Collection Time: 12/06/16 10:52 AM  Result Value Ref Range   Color, Urine AMBER (A) YELLOW    Comment: BIOCHEMICALS MAY BE AFFECTED BY COLOR   APPearance HAZY (A) CLEAR   Specific Gravity, Urine 1.028 1.005 - 1.030   pH 5.0 5.0 - 8.0   Glucose, UA NEGATIVE NEGATIVE mg/dL   Hgb urine dipstick NEGATIVE NEGATIVE   Bilirubin Urine NEGATIVE NEGATIVE   Ketones, ur NEGATIVE NEGATIVE mg/dL   Protein, ur NEGATIVE NEGATIVE mg/dL   Nitrite NEGATIVE NEGATIVE   Leukocytes, UA NEGATIVE NEGATIVE   RBC / HPF 0-5 0 - 5 RBC/hpf   WBC, UA 0-5 0 - 5 WBC/hpf   Bacteria, UA NONE SEEN NONE SEEN   Squamous  Epithelial / LPF NONE SEEN NONE SEEN   Mucus PRESENT   Chlamydia/NGC rt PCR ([facility] only)     Status: None   Collection Time: 12/06/16 10:52 AM  Result Value Ref Range   Specimen source GC/Chlam URINE, RANDOM    Chlamydia Tr NOT DETECTED NOT DETECTED   N gonorrhoeae NOT DETECTED NOT DETECTED    Comment: (NOTE) 100  This methodology has not been evaluated in pregnant women or in 200  patients with a history of hysterectomy. 300 400  This methodology will not be performed on patients less than 51  years of age.   Urinalysis, Complete     Status: Abnormal   Collection Time: 12/31/16  8:51 AM  Result Value Ref Range   Specific Gravity, UA >1.030 (H) 1.005 - 1.030   pH, UA 5.5 5.0 - 7.5   Color, UA Yellow Yellow   Appearance Ur Cloudy (A) Clear   Leukocytes, UA Negative Negative   Protein, UA Trace (A) Negative/Trace   Glucose, UA Negative Negative   Ketones, UA Negative Negative   RBC, UA Negative Negative   Bilirubin, UA Negative Negative   Urobilinogen, Ur 0.2 0.2 - 1.0 mg/dL   Nitrite, UA Negative Negative   Microscopic Examination See below:   Microscopic Examination  Status: Abnormal   Collection Time: 12/31/16  8:51 AM  Result Value Ref Range   WBC, UA None seen 0 - 5 /hpf   RBC, UA None seen 0 - 2 /hpf   Epithelial Cells (non renal) None seen 0 - 10 /hpf   Mucus, UA Present (A) Not Estab.   Bacteria, UA Few (A) None seen/Few  BUN     Status: None   Collection Time: 02/11/17 11:52 AM  Result Value Ref Range   BUN 17 6 - 20 mg/dL  Creatinine, serum     Status: None   Collection Time: 02/11/17 11:52 AM  Result Value Ref Range   Creatinine, Ser 0.99 0.61 - 1.24 mg/dL   GFR calc non Af Amer >60 >60 mL/min   GFR calc Af Amer >60 >60 mL/min    Comment: (NOTE) The eGFR has been calculated using the CKD EPI equation. This calculation has not been validated in all clinical situations. eGFR's persistently <60 mL/min signify possible Chronic Kidney Disease.      Radiology No results found.   Assessment/Plan  Right varicocele This was almost certainly dramatically worsened by his inferior vena cava and iliac vein occlusion.  This has been treated which should depressurized the varicocele although I would not expect that to cure it.  At this point, if surgical repair is being considered, I think it would be much safer and less bloody than it was before his recent recanalization.  I will defer the decision on whether or not to fix this to the urologist.  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  At high risk for rethrombosis, although the he will maintain his anticoagulation.  He has had some mild symptomatic relief after this but not dramatic.  No further therapy planned at this time other than ongoing anticoagulation and fashion.  Return as needed.    Leotis Pain, MD  03/05/2017 10:25 AM    This note was created with Dragon medical transcription system.  Any errors from dictation are purely unintentional

## 2017-03-05 NOTE — Assessment & Plan Note (Signed)
Continue compression stockings and elevation. 

## 2017-03-05 NOTE — Assessment & Plan Note (Signed)
Status post recent recanalization.  At high risk for rethrombosis, although the he will maintain his anticoagulation.  He has had some mild symptomatic relief after this but not dramatic.  No further therapy planned at this time other than ongoing anticoagulation and fashion.  Return as needed.

## 2017-03-25 ENCOUNTER — Other Ambulatory Visit (INDEPENDENT_AMBULATORY_CARE_PROVIDER_SITE_OTHER): Payer: Self-pay | Admitting: Vascular Surgery

## 2017-04-28 ENCOUNTER — Encounter: Payer: Self-pay | Admitting: Emergency Medicine

## 2017-04-28 ENCOUNTER — Emergency Department: Payer: Medicaid Other

## 2017-04-28 ENCOUNTER — Emergency Department
Admission: EM | Admit: 2017-04-28 | Discharge: 2017-04-28 | Disposition: A | Discharge status: Home / Self Care | Payer: Medicaid Other | Attending: Emergency Medicine | Admitting: Emergency Medicine

## 2017-04-28 ENCOUNTER — Other Ambulatory Visit: Payer: Self-pay

## 2017-04-28 DIAGNOSIS — M20011 Mallet finger of right finger(s): Secondary | ICD-10-CM | POA: Diagnosis not present

## 2017-04-28 DIAGNOSIS — S61214A Laceration without foreign body of right ring finger without damage to nail, initial encounter: Secondary | ICD-10-CM | POA: Insufficient documentation

## 2017-04-28 DIAGNOSIS — W312XXA Contact with powered woodworking and forming machines, initial encounter: Secondary | ICD-10-CM | POA: Diagnosis not present

## 2017-04-28 DIAGNOSIS — Y939 Activity, unspecified: Secondary | ICD-10-CM | POA: Insufficient documentation

## 2017-04-28 DIAGNOSIS — S61216A Laceration without foreign body of right little finger without damage to nail, initial encounter: Secondary | ICD-10-CM | POA: Insufficient documentation

## 2017-04-28 DIAGNOSIS — Y929 Unspecified place or not applicable: Secondary | ICD-10-CM | POA: Insufficient documentation

## 2017-04-28 DIAGNOSIS — F17210 Nicotine dependence, cigarettes, uncomplicated: Secondary | ICD-10-CM | POA: Diagnosis not present

## 2017-04-28 DIAGNOSIS — S62664A Nondisplaced fracture of distal phalanx of right ring finger, initial encounter for closed fracture: Secondary | ICD-10-CM

## 2017-04-28 DIAGNOSIS — S61314A Laceration without foreign body of right ring finger with damage to nail, initial encounter: Secondary | ICD-10-CM

## 2017-04-28 DIAGNOSIS — Z79899 Other long term (current) drug therapy: Secondary | ICD-10-CM | POA: Diagnosis not present

## 2017-04-28 DIAGNOSIS — S61316A Laceration without foreign body of right little finger with damage to nail, initial encounter: Secondary | ICD-10-CM

## 2017-04-28 DIAGNOSIS — Z7901 Long term (current) use of anticoagulants: Secondary | ICD-10-CM | POA: Insufficient documentation

## 2017-04-28 DIAGNOSIS — S6991XA Unspecified injury of right wrist, hand and finger(s), initial encounter: Secondary | ICD-10-CM | POA: Diagnosis present

## 2017-04-28 DIAGNOSIS — Y999 Unspecified external cause status: Secondary | ICD-10-CM | POA: Diagnosis not present

## 2017-04-28 DIAGNOSIS — T1490XA Injury, unspecified, initial encounter: Secondary | ICD-10-CM

## 2017-04-28 DIAGNOSIS — Z23 Encounter for immunization: Secondary | ICD-10-CM | POA: Diagnosis not present

## 2017-04-28 MED ORDER — BUPIVACAINE HCL (PF) 0.5 % IJ SOLN
30.0000 mL | Freq: Once | INTRAMUSCULAR | Status: AC
  Administered 2017-04-28: 30 mL

## 2017-04-28 MED ORDER — BUPIVACAINE HCL (PF) 0.5 % IJ SOLN
INTRAMUSCULAR | Status: AC
  Administered 2017-04-28: 30 mL
  Filled 2017-04-28: qty 30

## 2017-04-28 MED ORDER — CEPHALEXIN 500 MG PO CAPS
500.0000 mg | ORAL_CAPSULE | Freq: Once | ORAL | Status: AC
  Administered 2017-04-28: 500 mg via ORAL
  Filled 2017-04-28: qty 1

## 2017-04-28 MED ORDER — CEPHALEXIN 500 MG PO CAPS: 500.0000 mg | ORAL_CAPSULE | Freq: Four times a day (QID) | ORAL | 0 refills | Status: DC

## 2017-04-28 MED ORDER — HYDROCODONE-ACETAMINOPHEN 5-325 MG PO TABS: 1.0000 | ORAL_TABLET | Freq: Four times a day (QID) | ORAL | 0 refills | Status: DC | PRN

## 2017-04-28 MED ORDER — TETANUS-DIPHTH-ACELL PERTUSSIS 5-2.5-18.5 LF-MCG/0.5 IM SUSP
0.5000 mL | Freq: Once | INTRAMUSCULAR | Status: AC
  Administered 2017-04-28: 0.5 mL via INTRAMUSCULAR
  Filled 2017-04-28: qty 0.5

## 2017-04-28 NOTE — ED Provider Notes (Addendum)
[facility] Emergency Department Provider Note   ____________________________________________   First MD Initiated Contact with Patient 04/28/17 1716     (approximate)  I have reviewed the triage vital signs and the nursing notes.   HISTORY  Chief Complaint Laceration   HPI [name] is a 56 y.o. male ports a history of DVT on Eliquis, last dose about 10 AM today  About 1 hour ago, patient reports that he was using a skill saw to cut wood for his damage mailbox when he had the saw jumped and it cut his finger.  He reports that none of the finger was actually amputated, but he reports that he had to bandage it and it was "all tore up".  No other injury.  Injury to a single finger which is his right ring  No nausea or vomiting.  No fevers or headaches.  No other injury.  Reports his blood only a small amount.  His tetanus shot he is not quite sure when his last was.  No known drug allergies.  No numbness in the finger.  Reports it hurts to move so he does not know if it is weak at all.  He also reports that he is given the top of the nail bed on his little finger on the right hand.  He is right-hand dominant.  Reports a stinging burning pain throughout the right ring worse with movement  Past Medical History:  Diagnosis Date  . DVT (deep venous thrombosis) (HCC)   . Hepatitis    hep C, planning on treating with medication.  Marland Kitchen History of chickenpox     Patient Active Problem List   Diagnosis Date Noted  . Right varicocele 02/05/2017  . 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 DVT (deep venous thrombosis) (HCC) 02/05/2017  . 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 Weakness 01/02/2016  . Elevated troponin 01/02/2016  . History of DVT (deep vein thrombosis) 01/02/2016    Past Surgical History:  Procedure Laterality Date  . APPENDECTOMY    . IVC FILTER PLACEMENT ([facility] HX)  2009  . IVC FILTER REMOVAL N/A 02/11/2017   Procedure: IVC FILTER REMOVAL;  Surgeon: Annice Needy, MD;  Location: [facility] INVASIVE CV LAB;  Service: Cardiovascular;  Laterality: N/A;  . LUNG SURGERY Right 2009   resection, old pneumonia scar  . NECK SURGERY  1982   fusion C6-7  . SHOULDER ARTHROSCOPY WITH OPEN ROTATOR CUFF REPAIR Right 05/10/2016   Procedure: SHOULDER ARTHROSCOPY WITH DEBRIDEMENT, DECOMPRESSION, AND OPEN ROTATOR CUFF REPAIR;  Surgeon: Christena Flake, MD;  Location: [facility] ORS;  Service: Orthopedics;  Laterality: Right;  . TONSILLECTOMY      Prior to Admission medications   Medication Sig Start Date End Date Taking? Authorizing Provider  cephALEXin (KEFLEX) 500 MG capsule Take 1 capsule (500 mg total) by mouth 4 (four) times daily. 04/28/17   Sharyn Creamer, MD  ELIQUIS 5 MG TABS tablet TAKE 1 TABLET BY MOUTH 2 TIMES DAILY 03/25/17   Stegmayer, Ranae Plumber, PA-C  HYDROcodone-acetaminophen (NORCO/VICODIN) 5-325 MG tablet Take 1 tablet by mouth every 6 (six) hours as needed for moderate pain. 04/28/17   Sharyn Creamer, MD  Multiple Vitamins-Minerals (MULTIVITAMIN WITH MINERALS) tablet Take 1 tablet by mouth daily.    [provider]    Allergies Patient has no known allergies.  Family History  Problem Relation Age of Onset  . Heart attack Father   . Heart attack Brother   .  Prostate cancer Neg Hx   . Bladder Cancer Neg Hx   . Kidney cancer Neg Hx     Social History Social History   Tobacco Use  . Smoking status: Current Every Day Smoker    Packs/day: 1.00    Types: Cigarettes  . Smokeless tobacco: Never Used  Substance Use Topics  . Alcohol use: Yes    Comment: 2 pints whiskey a week or maybe 2-5 beers a week   . Drug use: Yes    Frequency: 2.0 times per week    Types: Marijuana    Review of Systems Constitutional: No fever/chills Eyes: No visual changes. ENT: No sore throat. Cardiovascular: Denies chest pain. Respiratory: Denies shortness of breath. Gastrointestinal: No abdominal pain.     Genitourinary: No troubles with urination Musculoskeletal: Negative for back pain. Skin: Negative for rash. Neurological: Negative for headaches, weakness, or numbness    ____________________________________________   PHYSICAL EXAM:  VITAL SIGNS: ED Triage Vitals  Enc Vitals Group     BP 04/28/17 1705 (!) 144/94     Pulse Rate 04/28/17 1705 (!) 107     Resp 04/28/17 1705 16     Temp 04/28/17 1705 98.1 F (36.7 C)     Temp Source 04/28/17 1705 Oral     SpO2 04/28/17 1705 98 %     Weight 04/28/17 1706 180 lb (81.6 kg)     Height 04/28/17 1706 6\' 2"  (1.88 m)     Head Circumference --      Peak Flow --      Pain Score 04/28/17 1704 8     Pain Loc --      Pain Edu? --      Excl. in GC? --     Constitutional: Alert and oriented. Well appearing and in no acute distress.  Appears in moderate pain, right hand shaking reports due to the pain in his right ring finger. Eyes: Conjunctivae are normal. Head: Atraumatic. Nose: No congestion/rhinnorhea. Mouth/Throat: Mucous membranes are moist. Neck: No stridor.   Cardiovascular: Normal rate, regular rhythm. Grossly normal heart sounds.  Good peripheral circulation. Respiratory: Normal respiratory effort.  No retractions. Lungs CTAB. Gastrointestinal: Soft and nontender. No distention. Musculoskeletal:   Right hand Median, ulnar, radial motor intact. Cap refill less than 2 seconds all digits. Strong radial pulse. 5 out of 5 strength throughout the hand intrinsics, flexion and extension at the wrist though there is somewhat slight limitation in flexion and extension of the right ring finger reported due to pain there does not appear to be an obvious deficit though he holds the tip of the finger and a mallet finger type position, and he does not appear to be able to fully extend the right ring finger at the DIP.  Sensation is normal at the tip of the right ring and little finger.  Patient has a very rough edged laceration overlying the  right ring finger both dorsal and volar surfaces over the middle phalanx, bleeding is well controlled.  In addition he has a small nailbed injury over the little finger where he has a very small piece of the nail bed that has been avulsed as well as a very small portion of the nail with bleeding controlled with a very minimal laceration medial to the nail bed that is extremely superficial.  Left hand Median, ulnar, radial motor intact. Cap refill less than 2 seconds all digits. Strong radial pulse. 5 out of 5 strength throughout the hand intrinsics, flexion and extension  at the wrist ____________________________________________   Neurologic:  Normal speech and language. No gross focal neurologic deficits are appreciated.  Skin:  Skin is warm, dry and intact. No rash noted. Psychiatric: Mood and affect are normal. Speech and behavior are normal.  ____________________________________________   LABS (all labs ordered are listed, but only abnormal results are displayed)  Labs Reviewed - No data to display ____________________________________________  EKG   ____________________________________________  RADIOLOGY  Dg Hand Complete Right  Result Date: 04/28/2017 CLINICAL DATA:  Saw injury to right ring and pinky fingers. EXAM: RIGHT HAND - COMPLETE 3+ VIEW COMPARISON:  None. FINDINGS: Bandages are present overlying the fourth and fifth fingers. There is evidence of an old fifth metacarpal fracture. Examination demonstrates displaced chip fractures involving the ulnar base of the fourth distal phalanx and distal ulnar aspect of the fourth middle phalanx. Mild degenerative change over the first carpometacarpal joint. Remainder of the exam is unremarkable. IMPRESSION: Minimally displaced chip fractures involving the base of the fourth distal phalanx and adjacent distal aspect of the fourth middle phalanx. Electronically Signed   By: Elberta Fortisaniel  Boyle M.D.   On: 04/28/2017 18:12    X-ray reviewed  personally by me, chip fractures involving the fourth distal phalanx, also reviewed into position by radiology ____________________________________________   PROCEDURES  Procedure(s) performed: Laceration  .Marland Kitchen.Laceration Repair Date/Time: 04/28/2017 6:42 PM Performed by: Sharyn CreamerQuale, Estella Malatesta, MD Authorized by: Sharyn CreamerQuale, Angee Gupton, MD   Consent:    Consent obtained:  Verbal   Consent given by:  Patient   Risks discussed:  Infection, poor cosmetic result, need for additional repair, nerve damage and poor wound healing Anesthesia (see MAR for exact dosages):    Anesthesia method:  None Laceration details:    Length (cm):  2 Repair type:    Repair type:  Simple Exploration:    Contaminated: no   Treatment:    Area cleansed with:  Betadine   Amount of cleaning:  Standard   Irrigation solution:  Sterile saline   Irrigation volume:  Tap water 3 minutes, betadine scrub, and topical betadine   Irrigation method:  Tap   Visualized foreign bodies/material removed: no   Skin repair:    Repair method:  Tissue adhesive Approximation:    Approximation:  Close Post-procedure details:    Dressing:  Open (no dressing)   Patient tolerance of procedure:  Tolerated well, no immediate complications Comments:     R 4th digit, volar surface and medial aspect,  .Marland Kitchen.Laceration Repair Date/Time: 04/28/2017 6:43 PM Performed by: Sharyn CreamerQuale, Florance Paolillo, MD Authorized by: Sharyn CreamerQuale, Harutyun Monteverde, MD   Consent:    Consent obtained:  Verbal   Consent given by:  Patient   Risks discussed:  Infection, pain, retained foreign body, poor cosmetic result and poor wound healing Anesthesia (see MAR for exact dosages):    Anesthesia method:  Local infiltration   Local anesthetic:  Bupivacaine 0.5% w/o epi Laceration details:    Location: right 4th finger.   Length (cm):  3   Depth (mm):  5 Repair type:    Repair type:  Intermediate Pre-procedure details:    Preparation:  Patient was prepped and draped in usual sterile fashion and imaging  obtained to evaluate for foreign bodies Exploration:    Hemostasis achieved with:  Direct pressure   Wound exploration: entire depth of wound probed and visualized     Wound extent: muscle damage, tendon damage and underlying fracture     Wound extent: no foreign bodies/material noted, no nerve damage noted and  no vascular damage noted     Tendon damage location:  Upper extremity (extensor tendon at the distal phalanx appears most likely severed )   Tendon repair plan:  Refer for evaluation (Dr. Stephenie Acres)   Contaminated: no   Treatment:    Area cleansed with:  Saline and Betadine   Amount of cleaning:  Extensive   Irrigation solution:  Sterile saline   Irrigation volume:  Large, tap water 3 minutes   Irrigation method:  Syringe and tap   Visualized foreign bodies/material removed: no   Skin repair:    Repair method:  Sutures   Suture size:  4-0   Suture material:  Prolene   Suture technique:  Simple interrupted   Number of sutures:  5 Approximation:    Approximation:  Loose   Vermilion border: poorly aligned (skin defect/missing and approximated as best possible with good result but not able to fully approximate over the laceration)   Post-procedure details:    Dressing:  Sterile dressing, non-adherent dressing and splint for protection   Patient tolerance of procedure:  Tolerated well, no immediate complications Comments:     Finger splint applied. Intact cap refill post application < 1 second     Critical Care performed: No   ____________________________________________   INITIAL IMPRESSION / ASSESSMENT AND PLAN / ED COURSE  Pertinent labs & imaging results that were available during my care of the patient were reviewed by me and considered in my medical decision making (see chart for details).  Skilsaw initially isolated to the fourth and fifth digits of the right hand.  X-ray does demonstrate fracture.  I am highly suspicious he is damaged the extensor tendon which has  been mangled in his severed based on my assessment as he also carries the hand with a mallet finger at the tip of the right fourth.  Clinical Course as of Apr 29 21  Sun Apr 28, 2017  1745 Digital block completed with approximately 3 mL's of bupivacaine, patient reports good effect with the right ring finger now numb.  He reports his pain is much improved and he is now resting comfortably.  Prior to digital block, he had normal sensation except for some very minimal blunting to sharp touch over the medial and distal aspects of both the ring and little finger on the right hand.  He is able to flex and extend both hands with some limitation due to pain in in the right ring finger  [MQ]  1915 Patient resting comfortably, understanding of plan for discharge.  Reports pain well controlled  [MQ]    Clinical Course User Index [MQ] Sharyn Creamer, MD    Case and care discussed with orthopedics, Dr. Lucius Conn, he advises follow-up with emergent Driscilla Grammes Dr. Stephenie Acres, and agrees with antibiotic therapy, attempted closure here, and splinting.  Return precautions and treatment recommendations and follow-up discussed with the patient who is agreeable with the plan.  Stressed with the patient the importance of following up with Dr. Stephenie Acres as soon as possible, he is in agreement and will set up an appointment for this week.  Very careful return precautions, treatment recommendations and need for antibiotics discussed with patient.  Drug database reviewed, last prescribed tramadol October 2018.  Currently reports not taking any pain medications.  Agreeable to not driving, safe use. I will prescribe the patient a narcotic pain medicine due to their condition which I anticipate will cause at least moderate pain short term. I discussed with the patient safe use  of narcotic pain medicines, and that they are not to drive, work in dangerous areas, or ever take more than prescribed (no more than 1 pill every 6 hours). We discussed  that this is the type of medication that can be  overdosed on and the risks of this type of medicine. Patient is very agreeable to only use as prescribed and to never use more than prescribed.  ____________________________________________   FINAL CLINICAL IMPRESSION(S) / ED DIAGNOSES  Final diagnoses:  Laceration of right ring finger without foreign body with damage to nail, initial encounter  Laceration of right little finger without foreign body with damage to nail, initial encounter  Closed nondisplaced fracture of distal phalanx of right ring finger, initial encounter  Mallet finger of right hand      NEW MEDICATIONS STARTED DURING THIS VISIT:  This SmartLink is deprecated. Use AVSMEDLIST instead to display the medication list for a patient.   Note:  This document was prepared using Dragon voice recognition software and may include unintentional dictation errors.     Sharyn Creamer, MD 04/28/17 8413    Sharyn Creamer, MD 04/29/17 [phone]

## 2017-04-28 NOTE — Discharge Instructions (Addendum)
You have been seen in the Emergency Department (ED) today for a laceration (cut).  Please keep the cut clean but do not submerge it in the water.  It has been repaired with staples or sutures that will need to be removed in about 7 days. Please follow up with your doctor Dr. Stephenie AcresSoria or [facility] within 1 week.  Please follow up with your doctor as soon as possible regarding today's emergent visit.   Return to the ED or call your doctor if you notice any signs of infection such as fever, increased pain, increased redness, pus, or other symptoms that concern you.

## 2017-04-28 NOTE — ED Triage Notes (Signed)
Pt to ED via POV for laceration to the right hand. Pt states that he cut his finger with a skill saw about 45 minutes PTA. Pt in NAD at this time.

## 2017-06-14 ENCOUNTER — Ambulatory Visit (INDEPENDENT_AMBULATORY_CARE_PROVIDER_SITE_OTHER): Payer: Medicaid Other | Admitting: Vascular Surgery

## 2017-06-14 ENCOUNTER — Encounter (INDEPENDENT_AMBULATORY_CARE_PROVIDER_SITE_OTHER): Payer: Self-pay | Admitting: Vascular Surgery

## 2017-06-14 VITALS — BP 118/79 | HR 94 | Resp 18 | Ht 74.0 in | Wt 185.8 lb

## 2017-06-14 DIAGNOSIS — I87009 Postthrombotic syndrome without complications of unspecified extremity: Secondary | ICD-10-CM

## 2017-06-14 DIAGNOSIS — I82220 Acute embolism and thrombosis of inferior vena cava: Secondary | ICD-10-CM

## 2017-06-14 DIAGNOSIS — I861 Scrotal varices: Secondary | ICD-10-CM

## 2017-06-14 NOTE — Progress Notes (Signed)
MRN : [id number]  [name] is a 56 y.o. (May 24, 1960) male who presents with chief complaint of  Chief Complaint  Patient presents with  . Follow-up    76yr follow up  .  History of Present Illness: Patient returns today in follow up of venous disease with postphlebitic syndrome.  After our intervention last year, that has helped both his varicocele and his lower extremity pain and swelling.  He seems to be troubled by some sciatica type pain currently which he has had in the past.  He has had good response to Flexeril with this previously and asks for a prescription of that today.  Current Outpatient Medications  Medication Sig Dispense Refill  . ELIQUIS 5 MG TABS tablet TAKE 1 TABLET BY MOUTH 2 TIMES DAILY 60 tablet 3  . Multiple Vitamins-Minerals (MULTIVITAMIN WITH MINERALS) tablet Take 1 tablet by mouth daily.    . cephALEXin (KEFLEX) 500 MG capsule Take 1 capsule (500 mg total) by mouth 4 (four) times daily. (Patient not taking: Reported on 06/14/2017) 40 capsule 0  . HYDROcodone-acetaminophen (NORCO/VICODIN) 5-325 MG tablet Take 1 tablet by mouth every 6 (six) hours as needed for moderate pain. (Patient not taking: Reported on 06/14/2017) 12 tablet 0   No current facility-administered medications for this visit.     Past Medical History:  Diagnosis Date  . DVT (deep venous thrombosis) (HCC)   . Hepatitis    hep C, planning on treating with medication.  Marland Kitchen History of chickenpox     Past Surgical History:  Procedure Laterality Date  . APPENDECTOMY    . IVC FILTER PLACEMENT ([facility] HX)  2009  . IVC FILTER REMOVAL N/A 02/11/2017   Procedure: IVC FILTER REMOVAL;  Surgeon: Annice Needy, MD;  Location: [facility] INVASIVE CV LAB;  Service: Cardiovascular;  Laterality: N/A;  . LUNG SURGERY Right 2009   resection, old pneumonia scar  . NECK SURGERY  1982   fusion C6-7  . SHOULDER ARTHROSCOPY WITH OPEN ROTATOR CUFF REPAIR Right 05/10/2016   Procedure: SHOULDER ARTHROSCOPY WITH DEBRIDEMENT,  DECOMPRESSION, AND OPEN ROTATOR CUFF REPAIR;  Surgeon: Christena Flake, MD;  Location: [facility] ORS;  Service: Orthopedics;  Laterality: Right;  . TONSILLECTOMY              Social History  Substance Use Topics  . Smoking status: Current Every Day Smoker    Packs/day: 1.00    Types: Cigarettes  . Smokeless tobacco: Never Used  . Alcohol use Yes      Comment: 2 pints whiskey a week or maybe 2-5 beers a week     Family History      Family History  Problem Relation Age of Onset  . Heart attack Father   . Heart attack Brother   No bleeding disorders, clotting disorders, or aneurysms  No Known Allergies   REVIEW OF SYSTEMS(Negative unless checked)  Constitutional: [] Weight loss[] Fever[] Chills Cardiac:[] Chest pain[] Chest pressure[] Palpitations [] Shortness of breath when laying flat [] Shortness of breath at rest [] Shortness of breath with exertion. Vascular: [] Pain in legs with walking[] Pain in legsat rest[] Pain in legs when laying flat [] Claudication [] Pain in feet when walking [] Pain in feet at rest [] Pain in feet when laying flat [x] History of DVT [] Phlebitis [x] Swelling in legs [] Varicose veins [] Non-healing ulcers Pulmonary: [] Uses home oxygen [] Productive cough[] Hemoptysis [] Wheeze [] COPD [] Asthma Neurologic: [] Dizziness [] Blackouts [] Seizures [] History of stroke [] History of TIA[] Aphasia [] Temporary blindness[] Dysphagia [] Weaknessor numbness in arms [] Weakness or numbnessin legs Musculoskeletal: [] Arthritis [] Joint swelling [] Joint pain [] Low back pain Hematologic:[] Easy  bruising[] Easy bleeding [] Hypercoagulable state [] Anemic  Gastrointestinal:[] Blood in stool[] Vomiting blood[] Gastroesophageal reflux/heartburn[] Abdominal pain Genitourinary: [] Chronic kidney disease [] Difficulturination [] Frequenturination [] Burning with urination[] Hematuria X positive for  large, symptomatic varicocele Skin: [] Rashes [] Ulcers [] Wounds Psychological: [] History of anxiety[] History of major depression.      Physical Examination  BP 118/79 (BP Location: Right Arm)   Pulse 94   Resp 18   Ht 6\' 2"  (1.88 m)   Wt 84.3 kg (185 lb 12.8 oz)   BMI 23.86 kg/m  Gen:  WD/WN, NAD Head: [place]/AT, No temporalis wasting. Ear/Nose/Throat: Hearing grossly intact, nares w/o erythema or drainage, trachea midline Eyes: Conjunctiva clear. Sclera non-icteric Neck: Supple.  No JVD.  Pulmonary:  Good air movement, no use of accessory muscles.  Cardiac: RRR, normal S1, S2 Vascular:  Vessel Right Left  Radial Palpable Palpable                                    Musculoskeletal: M/S 5/5 throughout.  No deformity or atrophy. 1+ BLE edema. Neurologic: Sensation grossly intact in extremities.  Symmetrical.  Speech is fluent.  Psychiatric: Judgment intact, Mood & affect appropriate for pt's clinical situation. Dermatologic: No rashes or ulcers noted.  No cellulitis or open wounds.       Labs No results found for this or any previous visit (from the past 2160 hour(s)).  Radiology No results found.   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 Overall doing well with this.  Inferior vena cava occlusion (HCC) Status post recent recanalization.  At high risk for rethrombosis, although the he will maintain his anticoagulation.  He has had some mild symptomatic relief after this but not dramatic.  No further therapy planned at this time other than ongoing anticoagulation and fashion.  Return in one year.    Right varicocele Symptoms better after venous intervention last year    Festus BarrenJason Doxie Augenstein, MD  06/14/2017 4:47 PM    This note was created with Dragon medical transcription system.  Any errors from dictation are purely unintentional

## 2017-06-14 NOTE — Assessment & Plan Note (Signed)
Symptoms better after venous intervention last year

## 2017-09-25 ENCOUNTER — Telehealth: Payer: Self-pay | Admitting: *Deleted

## 2017-09-25 DIAGNOSIS — Z87891 Personal history of nicotine dependence: Secondary | ICD-10-CM

## 2017-09-25 DIAGNOSIS — Z122 Encounter for screening for malignant neoplasm of respiratory organs: Secondary | ICD-10-CM

## 2017-09-25 NOTE — Telephone Encounter (Signed)
Received referral for initial lung cancer screening scan. Contacted patient and obtained smoking history,(current, 1ppd x 43 yrs) as well as answering questions related to screening process. Patient denies signs of lung cancer such as weight loss or hemoptysis. Patient denies comorbidity that would prevent curative treatment if lung cancer were found. Patient is scheduled for shared decision making visit and CT scan on 10/15/17.

## 2017-09-25 NOTE — Telephone Encounter (Signed)
Received referral for low dose lung cancer screening CT scan. Message left at phone number listed in EMR for patient to call me back to facilitate scheduling scan.  

## 2017-10-15 ENCOUNTER — Encounter: Payer: Self-pay | Admitting: Oncology

## 2017-10-15 ENCOUNTER — Inpatient Hospital Stay: Payer: Medicaid Other | Attending: Oncology | Admitting: Oncology

## 2017-10-15 ENCOUNTER — Ambulatory Visit
Admission: RE | Admit: 2017-10-15 | Discharge: 2017-10-15 | Disposition: A | Discharge status: Home / Self Care | Payer: Medicaid Other | Source: Ambulatory Visit | Attending: Oncology | Admitting: Oncology

## 2017-10-15 DIAGNOSIS — Z87891 Personal history of nicotine dependence: Secondary | ICD-10-CM | POA: Insufficient documentation

## 2017-10-15 DIAGNOSIS — Z122 Encounter for screening for malignant neoplasm of respiratory organs: Secondary | ICD-10-CM

## 2017-10-15 DIAGNOSIS — I700 Atherosclerosis of aorta: Secondary | ICD-10-CM | POA: Diagnosis not present

## 2017-10-15 DIAGNOSIS — I2510 Atherosclerotic heart disease of native coronary artery without angina pectoris: Secondary | ICD-10-CM | POA: Diagnosis not present

## 2017-10-15 DIAGNOSIS — J438 Other emphysema: Secondary | ICD-10-CM | POA: Insufficient documentation

## 2017-10-15 DIAGNOSIS — J432 Centrilobular emphysema: Secondary | ICD-10-CM | POA: Insufficient documentation

## 2017-10-15 NOTE — Progress Notes (Signed)
In accordance with CMS guidelines, patient has met eligibility criteria including age, absence of signs or symptoms of lung cancer.  Social History   Tobacco Use  . Smoking status: Current Every Day Smoker    Packs/day: 1.00    Years: 43.00    Pack years: 43.00    Types: Cigarettes  . Smokeless tobacco: Never Used  Substance Use Topics  . Alcohol use: Yes    Comment: 2 pints whiskey a week or maybe 2-5 beers a week   . Drug use: Yes    Frequency: 2.0 times per week    Types: Marijuana     A shared decision-making session was conducted prior to the performance of CT scan. This includes one or more decision aids, includes benefits and harms of screening, follow-up diagnostic testing, over-diagnosis, false positive rate, and total radiation exposure.  Counseling on the importance of adherence to annual lung cancer LDCT screening, impact of co-morbidities, and ability or willingness to undergo diagnosis and treatment is imperative for compliance of the program.  Counseling on the importance of continued smoking cessation for former smokers; the importance of smoking cessation for current smokers, and information about tobacco cessation interventions have been given to patient including [place] and [facility] quit [place] programs.  Written order for lung cancer screening with LDCT has been given to the patient and any and all questions have been answered to the best of my abilities.   Yearly follow up will be coordinated by Burgess Estelle, Thoracic Navigator.  Faythe Casa, NP 10/15/2017 11:09 AM

## 2017-10-21 ENCOUNTER — Telehealth: Payer: Self-pay | Admitting: *Deleted

## 2017-10-21 NOTE — Telephone Encounter (Signed)
Notified patient of LDCT lung cancer screening program results with recommendation for 12 month follow up imaging. Also notified of incidental findings noted below and is encouraged to discuss further with PCP who will receive a copy of this note and/or the CT report. Patient verbalizes understanding.   IMPRESSION: 1. Lung-RADS 2, benign appearance or behavior. Continue annual screening with low-dose chest CT without contrast in 12 months. 2. The "S" modifier above refers to potentially clinically significant non lung cancer related findings. Specifically, there is aortic atherosclerosis, in addition to left anterior descending coronary artery disease. Please note that although the presence of coronary artery calcium documents the presence of coronary artery disease, the severity of this disease and any potential stenosis cannot be assessed on this non-gated CT examination. Assessment for potential risk factor modification, dietary therapy or pharmacologic therapy may be warranted, if clinically indicated. 3. Mild diffuse bronchial wall thickening with mild centrilobular and paraseptal emphysema; imaging findings suggestive of underlying COPD.  Aortic Atherosclerosis (ICD10-I70.0) and Emphysema (ICD10-J43.9).

## 2017-12-04 IMAGING — MR MR SHOULDER*R* W/O CM
5 series · 40 of 40 positions shown · non-contrast
Comparison: None.

CLINICAL DATA: Chronic right shoulder pain and limited range of
motion.

EXAM:
MRI OF THE RIGHT SHOULDER WITHOUT CONTRAST
TECHNIQUE: Multiplanar, multisequence MR imaging of the shoulder was performed.
No intravenous contrast was administered.

[Series 3: T2 fat-sat · axial · 4.0mm · 0.47mm/px · z∈[-39,+67]mm · 8 of 24 slices shown (1 of 3)]
[im 1/24]
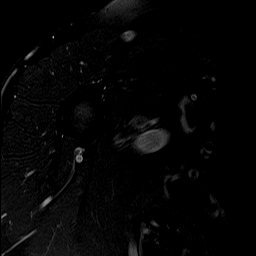
[im 4/24]
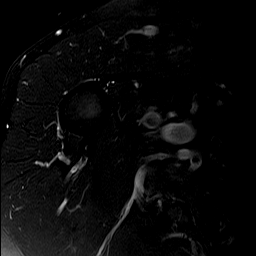
[im 7/24]
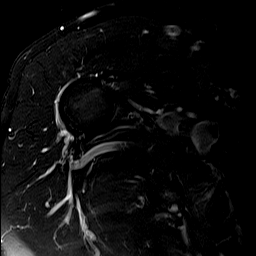
[im 10/24]
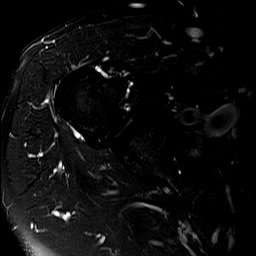
[im 14/24]
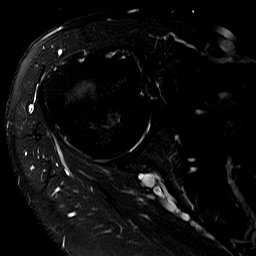
[im 17/24]
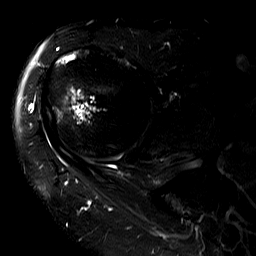
[im 20/24]
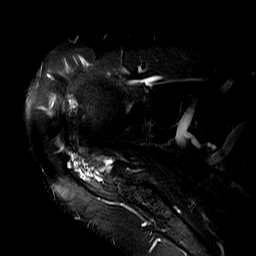
[im 24/24]
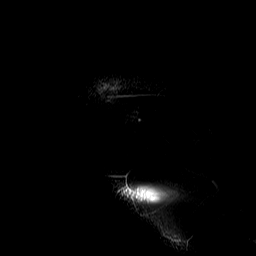

[Series 4: T2 fat-sat · oblique · 4.0mm · 0.62mm/px · 8 of 21 slices shown (2 of 3)]
[im 1/21]
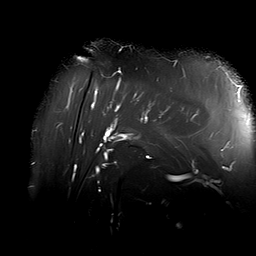
[im 3/21]
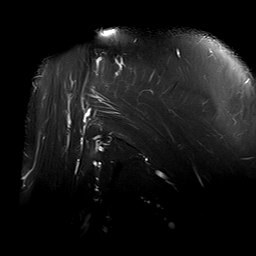
[im 6/21]
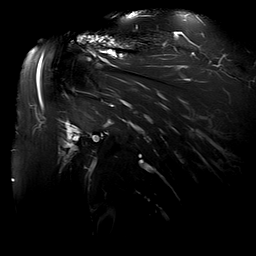
[im 9/21]
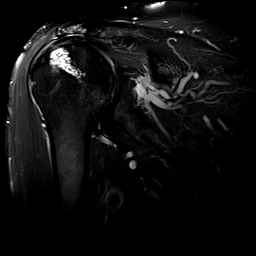
[im 12/21]
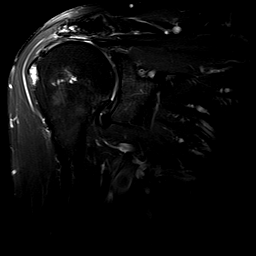
[im 15/21]
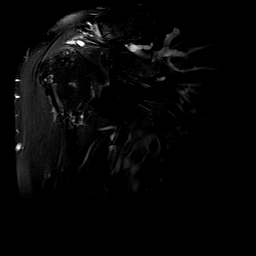
[im 18/21]
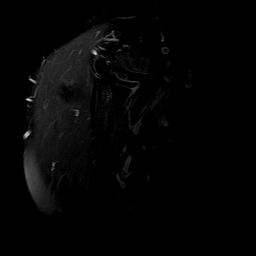
[im 21/21]
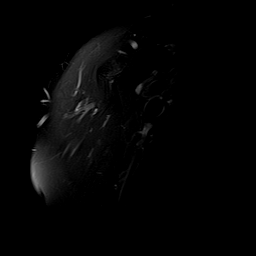

[Series 5: PD · oblique · 4.0mm · 0.62mm/px · 8 of 21 slices shown]
[im 1/21]
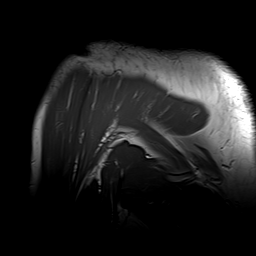
[im 3/21]
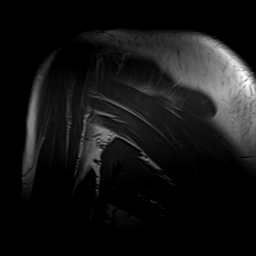
[im 6/21]
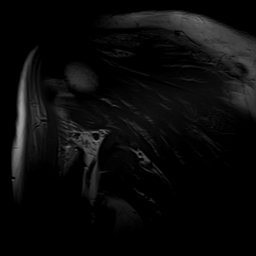
[im 9/21]
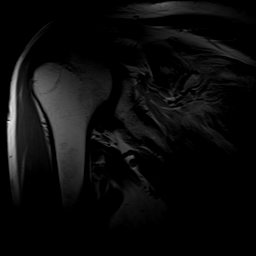
[im 12/21]
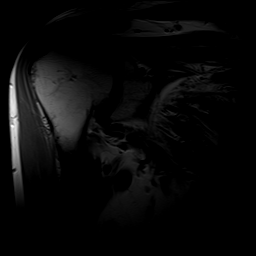
[im 15/21]
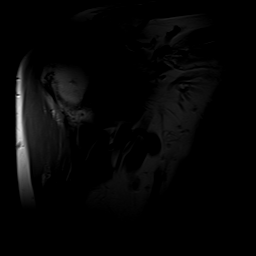
[im 18/21]
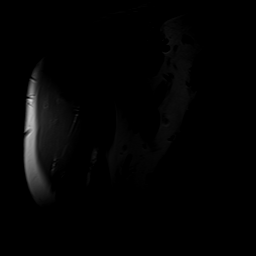
[im 21/21]
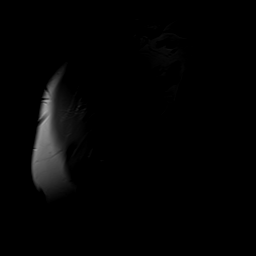

[Series 6: T1 · oblique · 4.0mm · 0.62mm/px · 8 of 21 slices shown]
[im 1/21]
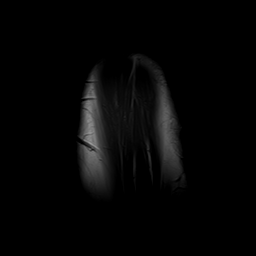
[im 3/21]
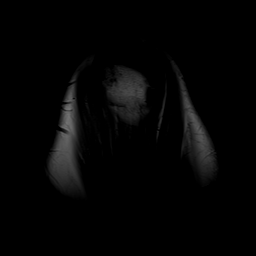
[im 6/21]
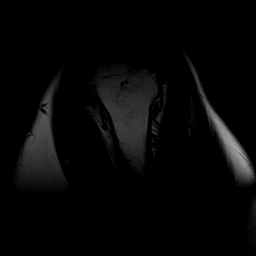
[im 9/21]
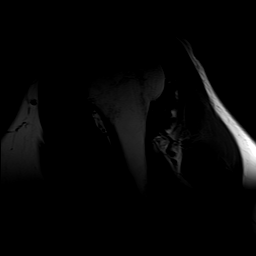
[im 12/21]
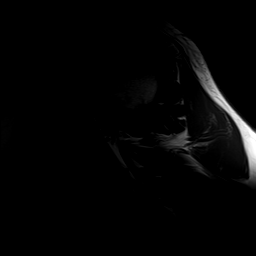
[im 15/21]
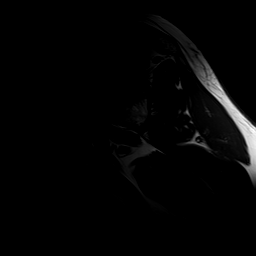
[im 18/21]
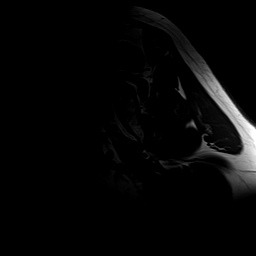
[im 21/21]
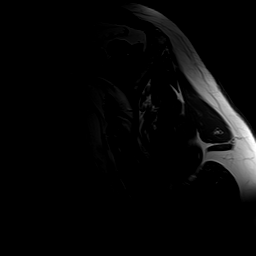

[Series 7: T2 fat-sat · oblique · 4.0mm · 0.62mm/px · 8 of 21 slices shown (3 of 3)]
[im 1/21]
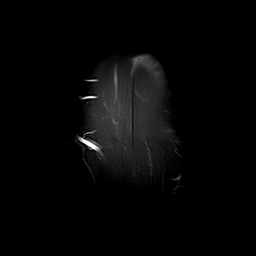
[im 3/21]
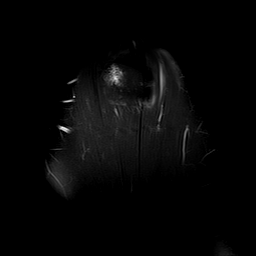
[im 6/21]
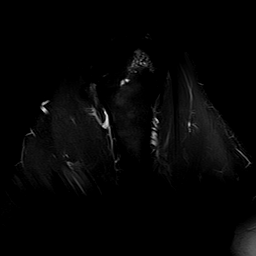
[im 9/21]
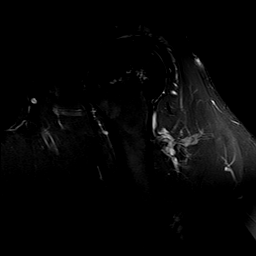
[im 12/21]
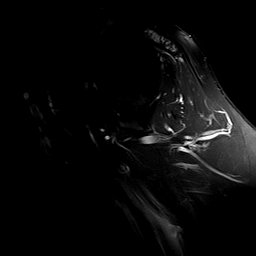
[im 15/21]
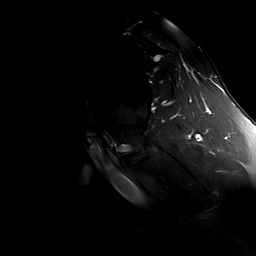
[im 18/21]
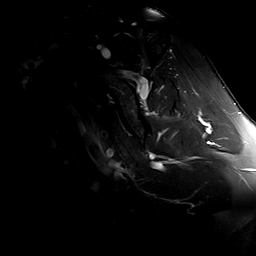
[im 21/21]
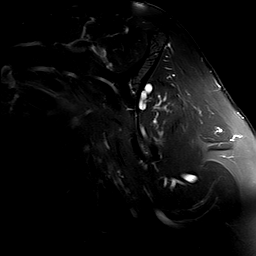

[40 of 40 positions shown; findings below may reference images not displayed]

FINDINGS: Rotator cuff: Severe thinning of the supraspinatus tendon with a
thin rim of residual tendon. I do not see an obvious full-thickness
retracted tear. Patient is at high risk for such. The subscapularis
tendon is also markedly and but not obviously torn.

Muscles:  Mild fatty atrophy of the subscapularis muscle.

Biceps long head:  Torn and retracted.

Acromioclavicular Joint: Minimal degenerative changes. The acromion
is type 2 in shape.

Glenohumeral Joint: Mild to moderate degenerative changes. Small
joint effusion.

Labrum: Degenerated and likely torn superior labrum. The anterior
and posterior labor are grossly normal.

Bones: No acute bony findings. Multi septated cystic lesions are
noted in the humeral head and acromion. These are most likely
intraosseous ganglia.
IMPRESSION: 1. Severe thinning of the supraspinatus and subscapularis tendons at
risk for full-thickness tear.
2. Torn and retracted long head biceps tendon. The superior labrum
is also degenerated and likely torn.
3. No significant findings for bony impingement.
4. Mild to moderate glenohumeral joint degenerative changes.
5. Multi septated cystic lesions in the humeral head and acromion
most consistent with intraosseous ganglia.

## 2018-05-22 ENCOUNTER — Other Ambulatory Visit: Payer: Self-pay | Admitting: Family

## 2018-05-22 ENCOUNTER — Ambulatory Visit
Admission: RE | Admit: 2018-05-22 | Discharge: 2018-05-22 | Disposition: A | Discharge status: Home / Self Care | Payer: Medicaid Other | Source: Ambulatory Visit | Attending: Family | Admitting: Family

## 2018-05-22 DIAGNOSIS — R0781 Pleurodynia: Secondary | ICD-10-CM

## 2018-06-20 ENCOUNTER — Ambulatory Visit (INDEPENDENT_AMBULATORY_CARE_PROVIDER_SITE_OTHER): Payer: Medicaid Other | Admitting: Vascular Surgery

## 2018-07-11 ENCOUNTER — Ambulatory Visit (INDEPENDENT_AMBULATORY_CARE_PROVIDER_SITE_OTHER): Payer: Medicaid Other | Admitting: Nurse Practitioner

## 2018-07-11 ENCOUNTER — Encounter (INDEPENDENT_AMBULATORY_CARE_PROVIDER_SITE_OTHER): Payer: Self-pay | Admitting: Nurse Practitioner

## 2018-07-11 ENCOUNTER — Other Ambulatory Visit: Payer: Self-pay

## 2018-07-11 VITALS — BP 118/78 | HR 79 | Resp 10 | Ht 74.0 in | Wt 172.0 lb

## 2018-07-11 DIAGNOSIS — I861 Scrotal varices: Secondary | ICD-10-CM

## 2018-07-11 DIAGNOSIS — Z86718 Personal history of other venous thrombosis and embolism: Secondary | ICD-10-CM

## 2018-07-11 DIAGNOSIS — F17210 Nicotine dependence, cigarettes, uncomplicated: Secondary | ICD-10-CM | POA: Diagnosis not present

## 2018-07-11 DIAGNOSIS — Z7901 Long term (current) use of anticoagulants: Secondary | ICD-10-CM

## 2018-07-11 DIAGNOSIS — I87009 Postthrombotic syndrome without complications of unspecified extremity: Secondary | ICD-10-CM | POA: Diagnosis not present

## 2018-07-13 ENCOUNTER — Encounter (INDEPENDENT_AMBULATORY_CARE_PROVIDER_SITE_OTHER): Payer: Self-pay | Admitting: Nurse Practitioner

## 2018-07-13 NOTE — Progress Notes (Signed)
SUBJECTIVE:  Patient ID: [name], male    DOB: 22-Nov-1960, 58 y.o.   MRN: [id number] Chief Complaint  Patient presents with  . Follow-up    HPI  [name] is a 58 y.o. male that presents today for follow-up and evaluation after lower extremity thrombectomy and intervention on 02/11/2017.  He states that he feels as good as he is felt in a long time.  He is not working as much as he usually does however when he is working he swells a little bit more despite use of his medical grade 1 compression stockings.  When he is not working and resting he swells less.  But overall he states that he feels really well.  Patient denies any fever, chills, nausea, vomiting or diarrhea.  He denies any chest pain or shortness of breath.  He denies any issues with the varicocele.  Past Medical History:  Diagnosis Date  . DVT (deep venous thrombosis) (HCC)   . Hepatitis    hep C, planning on treating with medication.  Marland Kitchen History of chickenpox     Past Surgical History:  Procedure Laterality Date  . APPENDECTOMY    . IVC FILTER PLACEMENT ([facility] HX)  2009  . IVC FILTER REMOVAL N/A 02/11/2017   Procedure: IVC FILTER REMOVAL;  Surgeon: Annice Needy, MD;  Location: [facility] INVASIVE CV LAB;  Service: Cardiovascular;  Laterality: N/A;  . LUNG SURGERY Right 2009   resection, old pneumonia scar  . NECK SURGERY  1982   fusion C6-7  . SHOULDER ARTHROSCOPY WITH OPEN ROTATOR CUFF REPAIR Right 05/10/2016   Procedure: SHOULDER ARTHROSCOPY WITH DEBRIDEMENT, DECOMPRESSION, AND OPEN ROTATOR CUFF REPAIR;  Surgeon: Christena Flake, MD;  Location: [facility] ORS;  Service: Orthopedics;  Laterality: Right;  . TONSILLECTOMY      Social History   Socioeconomic History  . Marital status: Single    Spouse name: Not on file  . Number of children: Not on file  . Years of education: Not on file  . Highest education level: Not on file  Occupational History  . Not on file  Social Needs  . Financial resource strain: Not on  file  . Food insecurity:    Worry: Not on file    Inability: Not on file  . Transportation needs:    Medical: Not on file    Non-medical: Not on file  Tobacco Use  . Smoking status: Current Every Day Smoker    Packs/day: 1.00    Years: 43.00    Pack years: 43.00    Types: Cigarettes  . Smokeless tobacco: Never Used  Substance and Sexual Activity  . Alcohol use: Yes    Comment: 2 pints whiskey a week or maybe 2-5 beers a week   . Drug use: Yes    Frequency: 2.0 times per week    Types: Marijuana  . Sexual activity: Not on file  Lifestyle  . Physical activity:    Days per week: Not on file    Minutes per session: Not on file  . Stress: Not on file  Relationships  . Social connections:    Talks on phone: Not on file    Gets together: Not on file    Attends religious service: Not on file    Active member of club or organization: Not on file    Attends meetings of clubs or organizations: Not on file    Relationship status: Not on file  . Intimate partner violence:  Fear of current or ex partner: Not on file    Emotionally abused: Not on file    Physically abused: Not on file    Forced sexual activity: Not on file  Other Topics Concern  . Not on file  Social History Narrative  . Not on file    Family History  Problem Relation Age of Onset  . Heart attack Father   . Heart attack Brother   . Prostate cancer Neg Hx   . Bladder Cancer Neg Hx   . Kidney cancer Neg Hx     No Known Allergies   Review of Systems   Review of Systems: Negative Unless Checked Constitutional: [] Weight loss  [] Fever  [] Chills Cardiac: [] Chest pain   []  Atrial Fibrillation  [] Palpitations   [] Shortness of breath when laying flat   [] Shortness of breath with exertion. [] Shortness of breath at rest Vascular:  [] Pain in legs with walking   [] Pain in legs with standing [] Pain in legs when laying flat   [] Claudication    [] Pain in feet when laying flat    [] History of DVT   [] Phlebitis    [x] Swelling in legs   [x] Varicose veins   [] Non-healing ulcers Pulmonary:   [] Uses home oxygen   [] Productive cough   [] Hemoptysis   [] Wheeze  [] COPD   [] Asthma Neurologic:  [] Dizziness   [] Seizures  [] Blackouts [] History of stroke   [] History of TIA  [] Aphasia   [] Temporary Blindness   [] Weakness or numbness in arm   [] Weakness or numbness in leg Musculoskeletal:   [] Joint swelling   [] Joint pain   [x] Low back pain  []  History of Knee Replacement [] Arthritis [] back Surgeries  []  Spinal Stenosis    Hematologic:  [x] Easy bruising  [x] Easy bleeding   [x] Hypercoagulable state   [] Anemic Gastrointestinal:  [] Diarrhea   [] Vomiting  [] Gastroesophageal reflux/heartburn   [] Difficulty swallowing. [] Abdominal pain Genitourinary:  [] Chronic kidney disease   [] Difficult urination  [] Anuric   [] Blood in urine [] Frequent urination  [] Burning with urination   [] Hematuria Skin:  [] Rashes   [] Ulcers [] Wounds Psychological:  [] History of anxiety   []  History of major depression  []  Memory Difficulties      OBJECTIVE:   Physical Exam  BP 118/78 (BP Location: Left Arm, Patient Position: Sitting, Cuff Size: Small)   Pulse 79   Resp 10   Ht 6\' 2"  (1.88 m)   Wt 172 lb (78 kg)   BMI 22.08 kg/m   Gen: WD/WN, NAD Head: [place]/AT, No temporalis wasting.  Ear/Nose/Throat: Hearing grossly intact, nares w/o erythema or drainage Eyes: PER, EOMI, sclera nonicteric.  Neck: Supple, no masses.  No JVD.  Pulmonary:  Good air movement, no use of accessory muscles.  Cardiac: RRR Vascular:  Scattered varicosities bilaterally.  1+ soft edema bilaterally. Vessel Right Left  Radial Palpable Palpable  Dorsalis Pedis Palpable Palpable  Posterior Tibial Palpable Palpable   Gastrointestinal: soft, non-distended. No guarding/no peritoneal signs.  Musculoskeletal: M/S 5/5 throughout.  No deformity or atrophy.  Neurologic: Pain and light touch intact in extremities.  Symmetrical.  Speech is fluent. Motor exam as listed  above. Psychiatric: Judgment intact, Mood & affect appropriate for pt's clinical situation. Dermatologic: No Venous rashes. No Ulcers Noted.  No changes consistent with cellulitis. Lymph : No Cervical lymphadenopathy, no lichenification or skin changes of chronic lymphedema.       ASSESSMENT AND PLAN:  1. 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  He reports doing well with this.  He also states that exercise helps.  Overall little complication.  2. History of DVT (deep vein thrombosis) Patient is at high risk for renal process.  Patient continues on anticoagulation without difficulty.  He will maintain anticoagulation.  He will follow-up in 1 year.  3. Right varicocele This is resolved.  Should patient continue to have pain or swelling he should follow-up with his primary care.   Current Outpatient Medications on File Prior to Visit  Medication Sig Dispense Refill  . ELIQUIS 5 MG TABS tablet TAKE 1 TABLET BY MOUTH 2 TIMES DAILY 60 tablet 3  . cephALEXin (KEFLEX) 500 MG capsule Take 1 capsule (500 mg total) by mouth 4 (four) times daily. (Patient not taking: Reported on 06/14/2017) 40 capsule 0  . HYDROcodone-acetaminophen (NORCO/VICODIN) 5-325 MG tablet Take 1 tablet by mouth every 6 (six) hours as needed for moderate pain. (Patient not taking: Reported on 06/14/2017) 12 tablet 0  . Multiple Vitamins-Minerals (MULTIVITAMIN WITH MINERALS) tablet Take 1 tablet by mouth daily.     No current facility-administered medications on file prior to visit.     There are no Patient Instructions on file for this visit. No follow-ups on file.   Georgiana Spinner, NP  This note was completed with Office manager.  Any errors are purely unintentional.

## 2018-10-23 ENCOUNTER — Emergency Department
Admission: EM | Admit: 2018-10-23 | Discharge: 2018-10-23 | Disposition: A | Discharge status: Home / Self Care | Payer: Medicaid Other | Attending: Emergency Medicine | Admitting: Emergency Medicine

## 2018-10-23 ENCOUNTER — Other Ambulatory Visit: Payer: Self-pay

## 2018-10-23 DIAGNOSIS — Y92007 Garden or yard of unspecified non-institutional (private) residence as the place of occurrence of the external cause: Secondary | ICD-10-CM | POA: Diagnosis not present

## 2018-10-23 DIAGNOSIS — Y999 Unspecified external cause status: Secondary | ICD-10-CM | POA: Diagnosis not present

## 2018-10-23 DIAGNOSIS — W57XXXA Bitten or stung by nonvenomous insect and other nonvenomous arthropods, initial encounter: Secondary | ICD-10-CM | POA: Diagnosis not present

## 2018-10-23 DIAGNOSIS — L089 Local infection of the skin and subcutaneous tissue, unspecified: Secondary | ICD-10-CM | POA: Insufficient documentation

## 2018-10-23 DIAGNOSIS — Z7901 Long term (current) use of anticoagulants: Secondary | ICD-10-CM | POA: Insufficient documentation

## 2018-10-23 DIAGNOSIS — F17210 Nicotine dependence, cigarettes, uncomplicated: Secondary | ICD-10-CM | POA: Diagnosis not present

## 2018-10-23 DIAGNOSIS — S50862A Insect bite (nonvenomous) of left forearm, initial encounter: Secondary | ICD-10-CM | POA: Diagnosis not present

## 2018-10-23 DIAGNOSIS — Z79899 Other long term (current) drug therapy: Secondary | ICD-10-CM | POA: Diagnosis not present

## 2018-10-23 DIAGNOSIS — Y93H2 Activity, gardening and landscaping: Secondary | ICD-10-CM | POA: Insufficient documentation

## 2018-10-23 MED ORDER — HYDROCODONE-ACETAMINOPHEN 5-325 MG PO TABS: 1.0000 | ORAL_TABLET | Freq: Four times a day (QID) | ORAL | 0 refills | Status: DC | PRN

## 2018-10-23 MED ORDER — CEPHALEXIN 500 MG PO CAPS: 500.0000 mg | ORAL_CAPSULE | Freq: Three times a day (TID) | ORAL | 0 refills | Status: AC

## 2018-10-23 NOTE — Discharge Instructions (Signed)
Follow-up with your primary care provider or return to the emergency department if any severe worsening of your symptoms.  Begin taking the Keflex along with your doxycycline.  A prescription for Norco was sent to your pharmacy which is for pain.  This is considered a narcotic so do not drive or operate machinery while taking this medication.  Use warm compresses to your arm frequently.  You may see some small drainage increased while using warm compresses.

## 2018-10-23 NOTE — ED Notes (Signed)
See triage note  Presents s/p possible insect bite to forearm    States he was working in the yard   States he was placed on doxy on Monday  But feel like it is not any better   Afebrile on arrival

## 2018-10-23 NOTE — ED Triage Notes (Signed)
Pt states he had two area on the left FA since Friday morning and had been gardening Thursday and thinks it was a spider bite, states he called his PCP on Monday and RX doxycycline, states not really getting any better. Areas seem to be contained on arrival.

## 2018-10-23 NOTE — ED Provider Notes (Signed)
[facility] Emergency Department Provider Note  ____________________________________________   First MD Initiated Contact with Patient 10/23/18 1336     (approximate)  I have reviewed the triage vital signs and the nursing notes.   HISTORY  Chief Complaint Insect Bite   HPI [name] is a 58 y.o. male presents to the ED with complaint of infection to his left forearm.  Patient states that he was working in the yard pruning bushes and believes that he was bitten by a spider.  He states that he called his PCP on Monday and was prescribed doxycycline which he is been taking twice a day since that time.  Patient states that the area is painful.  He denies any fever or chills.  Currently rates his pain as a 5/10.      Past Medical History:  Diagnosis Date  . DVT (deep venous thrombosis) (HCC)   . Hepatitis    hep C, planning on treating with medication.  Marland Kitchen. History of chickenpox     Patient Active Problem List   Diagnosis Date Noted  . Right varicocele 02/05/2017  . 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 DVT (deep venous thrombosis) (HCC) 02/05/2017  . 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 Weakness 01/02/2016  . Elevated troponin 01/02/2016  . History of DVT (deep vein thrombosis) 01/02/2016    Past Surgical History:  Procedure Laterality Date  . APPENDECTOMY    . IVC FILTER PLACEMENT ([facility] HX)  2009  . IVC FILTER REMOVAL N/A 02/11/2017   Procedure: IVC FILTER REMOVAL;  Surgeon: Annice Needyew, Jason S, MD;  Location: [facility] INVASIVE CV LAB;  Service: Cardiovascular;  Laterality: N/A;  . LUNG SURGERY Right 2009   resection, old pneumonia scar  . NECK SURGERY  1982   fusion C6-7  . SHOULDER ARTHROSCOPY WITH OPEN ROTATOR CUFF REPAIR Right 05/10/2016   Procedure: SHOULDER ARTHROSCOPY WITH DEBRIDEMENT, DECOMPRESSION, AND OPEN ROTATOR CUFF REPAIR;  Surgeon: Christena FlakeJohn J Poggi, MD;  Location: [facility] ORS;  Service: Orthopedics;  Laterality:  Right;  . TONSILLECTOMY      Prior to Admission medications   Medication Sig Start Date End Date Taking? Authorizing Provider  cephALEXin (KEFLEX) 500 MG capsule Take 1 capsule (500 mg total) by mouth 3 (three) times daily. 10/23/18   Tommi RumpsSummers,  L, PA-C  ELIQUIS 5 MG TABS tablet TAKE 1 TABLET BY MOUTH 2 TIMES DAILY 03/25/17   Stegmayer, Ranae PlumberKimberly A, PA-C  HYDROcodone-acetaminophen (NORCO/VICODIN) 5-325 MG tablet Take 1 tablet by mouth every 6 (six) hours as needed for moderate pain. 10/23/18   Tommi RumpsSummers,  L, PA-C  Multiple Vitamins-Minerals (MULTIVITAMIN WITH MINERALS) tablet Take 1 tablet by mouth daily.    [provider]    Allergies Patient has no known allergies.  Family History  Problem Relation Age of Onset  . Heart attack Father   . Heart attack Brother   . Prostate cancer Neg Hx   . Bladder Cancer Neg Hx   . Kidney cancer Neg Hx     Social History Social History   Tobacco Use  . Smoking status: Current Every Day Smoker    Packs/day: 1.00    Years: 43.00    Pack years: 43.00    Types: Cigarettes  . Smokeless tobacco: Never Used  Substance Use Topics  . Alcohol use: Yes    Comment: 2 pints whiskey a week or maybe 2-5 beers a week   . Drug use: Yes    Frequency: 2.0 times  per week    Types: Marijuana    Review of Systems Constitutional: No fever/chills Cardiovascular: Denies chest pain. Respiratory: Denies shortness of breath. Gastrointestinal:   No nausea, no vomiting.  Musculoskeletal: Pain left forearm. Skin: Positive for infection left forearm. Neurological: Negative for headaches, focal weakness or numbness. ___________________________________________   PHYSICAL EXAM:  VITAL SIGNS: ED Triage Vitals  Enc Vitals Group     BP 10/23/18 1230 128/90     Pulse Rate 10/23/18 1230 93     Resp 10/23/18 1230 20     Temp 10/23/18 1230 97.9 F (36.6 C)     Temp Source 10/23/18 1230 Oral     SpO2 10/23/18 1230 100 %     Weight 10/23/18 1230  185 lb (83.9 kg)     Height 10/23/18 1230 6\' 2"  (1.88 m)     Head Circumference --      Peak Flow --      Pain Score 10/23/18 1235 5     Pain Loc --      Pain Edu? --      Excl. in GC? --    Constitutional: Alert and oriented. Well appearing and in no acute distress. Eyes: Conjunctivae are normal.  Head: Atraumatic. Neck: No stridor.   Cardiovascular: Normal rate, regular rhythm. Grossly normal heart sounds.  Good peripheral circulation. Respiratory: Normal respiratory effort.  No retractions. Lungs CTAB. Musculoskeletal: Moves upper and lower extremities without any difficulty.  Normal gait was noted.  Left forearm muscle skeletal exam patient is able to flex and extend without any difficulty.  Range of motion is unaffected. Neurologic:  Normal speech and language. No gross focal neurologic deficits are appreciated. No gait instability. Skin:  Skin is warm, dry.  Left forearm has 2 separate erythematous areas that are approximately 2 cm in diameter each.  Both areas currently are open with minimal drainage.  Area is tender to palpation. Psychiatric: Mood and affect are normal. Speech and behavior are normal.  ____________________________________________   LABS (all labs ordered are listed, but only abnormal results are displayed)  Labs Reviewed - No data to display  PROCEDURES  Procedure(s) performed (including Critical Care):  Procedures   ____________________________________________   INITIAL IMPRESSION / ASSESSMENT AND PLAN / ED COURSE  As part of my medical decision making, I reviewed the following data within the electronic MEDICAL RECORD NUMBER Notes from prior ED visits and [place] Controlled Substance Database   58-year-old male presents to the ED with questionable spider bites after working in his yard over the weekend.  Patient states that he called his PCP on Monday and was placed on doxycycline when he saw 2 areas on his forearm that he thought were spider bites.  He states  that last evening the areas are more tender.  He denies any fever or chills.  Physical exam shows 2 individual areas on his forearm that are open with evidence of drainage.  Patient is continue taking doxycycline and a prescription for Keflex and Norco was sent to his pharmacy.  He is encouraged to use warm compresses to the area and follow-up with his PCP if any continued problems.  He is to return to the emergency department if any severe worsening, fever, chills.  ____________________________________________   FINAL CLINICAL IMPRESSION(S) / ED DIAGNOSES  Final diagnoses:  Bug bite with infection, initial encounter     ED Discharge Orders         Ordered    cephALEXin (KEFLEX) 500 MG capsule  3 times daily     10/23/18 1402    HYDROcodone-acetaminophen (NORCO/VICODIN) 5-325 MG tablet  Every 6 hours PRN     10/23/18 1402           Note:  This document was prepared using Dragon voice recognition software and may include unintentional dictation errors.    Johnn Hai, PA-C 10/23/18 1426    Schuyler Amor, MD 10/23/18 [phone]

## 2018-10-31 ENCOUNTER — Telehealth: Payer: Self-pay

## 2018-10-31 NOTE — Telephone Encounter (Signed)
Call pt regarding lung screening. Left message got pt to return call.   

## 2019-02-05 ENCOUNTER — Telehealth: Payer: Self-pay | Admitting: *Deleted

## 2019-02-05 NOTE — Telephone Encounter (Signed)
Left message for patient to notify them that it is time to schedule annual low dose lung cancer screening CT scan. Instructed patient to call back to verify information prior to the scan being scheduled.  

## 2019-02-06 ENCOUNTER — Telehealth: Payer: Self-pay | Admitting: *Deleted

## 2019-02-06 NOTE — Telephone Encounter (Signed)
Left message for patient to notify them that it is time to schedule annual low dose lung cancer screening CT scan. Instructed patient to call back to verify information prior to the scan being scheduled.  

## 2019-07-14 ENCOUNTER — Ambulatory Visit (INDEPENDENT_AMBULATORY_CARE_PROVIDER_SITE_OTHER): Payer: Medicaid Other | Admitting: Vascular Surgery

## 2019-07-28 ENCOUNTER — Telehealth: Payer: Self-pay

## 2019-07-28 NOTE — Telephone Encounter (Signed)
Unsuccessful attempt at calling patient to notify them that it is time to schedule the low dose lung cancer screening CT scan. 

## 2019-08-02 ENCOUNTER — Other Ambulatory Visit: Payer: Self-pay

## 2019-08-02 ENCOUNTER — Emergency Department: Payer: Medicaid Other

## 2019-08-02 ENCOUNTER — Emergency Department
Admission: EM | Admit: 2019-08-02 | Discharge: 2019-08-02 | Disposition: A | Discharge status: Home / Self Care | Payer: Medicaid Other | Attending: Student | Admitting: Student

## 2019-08-02 DIAGNOSIS — W19XXXA Unspecified fall, initial encounter: Secondary | ICD-10-CM

## 2019-08-02 DIAGNOSIS — Y92010 Kitchen of single-family (private) house as the place of occurrence of the external cause: Secondary | ICD-10-CM | POA: Diagnosis not present

## 2019-08-02 DIAGNOSIS — Y93G1 Activity, food preparation and clean up: Secondary | ICD-10-CM | POA: Insufficient documentation

## 2019-08-02 DIAGNOSIS — S299XXA Unspecified injury of thorax, initial encounter: Secondary | ICD-10-CM | POA: Diagnosis present

## 2019-08-02 DIAGNOSIS — Z7901 Long term (current) use of anticoagulants: Secondary | ICD-10-CM | POA: Insufficient documentation

## 2019-08-02 DIAGNOSIS — W01198A Fall on same level from slipping, tripping and stumbling with subsequent striking against other object, initial encounter: Secondary | ICD-10-CM | POA: Diagnosis not present

## 2019-08-02 DIAGNOSIS — F17210 Nicotine dependence, cigarettes, uncomplicated: Secondary | ICD-10-CM | POA: Diagnosis not present

## 2019-08-02 DIAGNOSIS — S20212A Contusion of left front wall of thorax, initial encounter: Secondary | ICD-10-CM | POA: Diagnosis not present

## 2019-08-02 DIAGNOSIS — Y999 Unspecified external cause status: Secondary | ICD-10-CM | POA: Diagnosis not present

## 2019-08-02 MED ORDER — HYDROCODONE-ACETAMINOPHEN 5-325 MG PO TABS: 1.0000 | ORAL_TABLET | ORAL | 0 refills | Status: AC | PRN

## 2019-08-02 MED ORDER — PREDNISONE 20 MG PO TABS
60.0000 mg | ORAL_TABLET | Freq: Once | ORAL | Status: AC
  Administered 2019-08-02: 60 mg via ORAL
  Filled 2019-08-02: qty 3

## 2019-08-02 MED ORDER — PREDNISONE 50 MG PO TABS: 50.0000 mg | ORAL_TABLET | Freq: Every day | ORAL | 0 refills | Status: AC

## 2019-08-02 MED ORDER — MELOXICAM 7.5 MG PO TABS
15.0000 mg | ORAL_TABLET | Freq: Once | ORAL | Status: AC
  Administered 2019-08-02: 15 mg via ORAL
  Filled 2019-08-02: qty 2

## 2019-08-02 MED ORDER — MELOXICAM 15 MG PO TABS: 15.0000 mg | ORAL_TABLET | Freq: Every day | ORAL | 0 refills | Status: AC

## 2019-08-02 NOTE — ED Provider Notes (Signed)
[facility] Emergency Department Provider Note  ____________________________________________  Time seen: Approximately 7:09 PM  I have reviewed the triage vital signs and the nursing notes.   HISTORY  Chief Complaint Fall    HPI [name] is a 59 y.o. male who presents the emergency department complaining of left rib pain after a mechanical fall.  Patient was cleaning some dishes in his house today, had set his cane next to the sink.  As he turned, his toe caught a cane causing him to fall.  Patient fell striking the left ribs against a cooler that he had in the kitchen.  Patient does have a history of previous fractures to the left ribs and was concerned that he may have reinjured same.  Patient did not hit his head or lose consciousness.  He denies any other complaints other than left rib pain.  No substernal pain, no shortness of breath, no cough.         Past Medical History:  Diagnosis Date  . DVT (deep venous thrombosis) ([place])   . Hepatitis    hep C, planning on treating with medication.  Marland Kitchen History of chickenpox     Patient Active Problem List   Diagnosis Date Noted  . Right varicocele 02/05/2017  . 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 DVT (deep venous thrombosis) ([place]) 02/05/2017  . 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 Weakness 01/02/2016  . Elevated troponin 01/02/2016  . History of DVT (deep vein thrombosis) 01/02/2016    Past Surgical History:  Procedure Laterality Date  . APPENDECTOMY    . IVC FILTER PLACEMENT ([place] HX)  2009  . IVC FILTER REMOVAL N/A 02/11/2017   Procedure: IVC FILTER REMOVAL;  Surgeon: Algernon Huxley, MD;  Location: [place] [facility];  Service: Cardiovascular;  Laterality: N/A;  . LUNG SURGERY Right 2009   resection, old pneumonia scar  . NECK SURGERY  1982   fusion C6-7  . SHOULDER ARTHROSCOPY WITH OPEN ROTATOR CUFF REPAIR Right 05/10/2016   Procedure: SHOULDER ARTHROSCOPY  WITH DEBRIDEMENT, DECOMPRESSION, AND OPEN ROTATOR CUFF REPAIR;  Surgeon: Corky Mull, MD;  Location: [facility] ORS;  Service: Orthopedics;  Laterality: Right;  . TONSILLECTOMY      Prior to Admission medications   Medication Sig Start Date End Date Taking? Authorizing Provider  cephALEXin (KEFLEX) 500 MG capsule Take 1 capsule (500 mg total) by mouth 3 (three) times daily. 10/23/18   Johnn Hai, PA-C  ELIQUIS 5 MG TABS tablet TAKE 1 TABLET BY MOUTH 2 TIMES DAILY 03/25/17   Stegmayer, Janalyn Harder, PA-C  HYDROcodone-acetaminophen (NORCO/VICODIN) 5-325 MG tablet Take 1 tablet by mouth every 4 (four) hours as needed for moderate pain. 08/02/19   Tayte Mcwherter, Charline Bills, PA-C  meloxicam (MOBIC) 15 MG tablet Take 1 tablet (15 mg total) by mouth daily. 08/02/19   Michoel Kunin, Charline Bills, PA-C  Multiple Vitamins-Minerals (MULTIVITAMIN WITH MINERALS) tablet Take 1 tablet by mouth daily.    [provider]  predniSONE (DELTASONE) 50 MG tablet Take 1 tablet (50 mg total) by mouth daily with breakfast. 08/02/19   Niza Soderholm, Charline Bills, PA-C    Allergies Patient has no known allergies.  Family History  Problem Relation Age of Onset  . Heart attack Father   . Heart attack Brother   . Prostate cancer Neg Hx   . Bladder Cancer Neg Hx   . Kidney cancer Neg Hx     Social History Social History   Tobacco  Use  . Smoking status: Current Every Day Smoker    Packs/day: 1.00    Years: 43.00    Pack years: 43.00    Types: Cigarettes  . Smokeless tobacco: Never Used  Substance Use Topics  . Alcohol use: Yes    Comment: 1/2 pint every day   . Drug use: Yes    Frequency: 2.0 times per week    Types: Marijuana    Comment: every day      Review of Systems  Constitutional: No fever/chills Eyes: No visual changes. No discharge ENT: No upper respiratory complaints. Cardiovascular: no chest pain. Respiratory: no cough. No SOB. Gastrointestinal: No abdominal pain.  No nausea, no vomiting.  No  diarrhea.  No constipation. Musculoskeletal: Left rib pain Skin: Negative for rash, abrasions, lacerations, ecchymosis. Neurological: Negative for headaches, focal weakness or numbness. 10-point ROS otherwise negative.  ____________________________________________   PHYSICAL EXAM:  VITAL SIGNS: ED Triage Vitals  Enc Vitals Group     BP 08/02/19 1757 111/84     Pulse Rate 08/02/19 1757 85     Resp 08/02/19 1757 14     Temp 08/02/19 1757 98.4 F (36.9 C)     Temp Source 08/02/19 1757 Oral     SpO2 08/02/19 1757 97 %     Weight 08/02/19 1758 178 lb (80.7 kg)     Height 08/02/19 1758 6\' 2"  (1.88 m)     Head Circumference --      Peak Flow --      Pain Score 08/02/19 1758 9     Pain Loc --      Pain Edu? --      Excl. in GC? --      Constitutional: Alert and oriented. Well appearing and in no acute distress. Eyes: Conjunctivae are normal. PERRL. EOMI. Head: Atraumatic. ENT:      Ears:       Nose: No congestion/rhinnorhea.      Mouth/Throat: Mucous membranes are moist.  Neck: No stridor.  No cervical spine tenderness to palpation.  Cardiovascular: Normal rate, regular rhythm. Normal S1 and S2.  Good peripheral circulation. Respiratory: Normal respiratory effort without tachypnea or retractions. Lungs CTAB. Good air entry to the bases with no decreased or absent breath sounds. Gastrointestinal: Bowel sounds 4 quadrants. Soft and nontender to palpation. No guarding or rigidity. No palpable masses. No distention. No CVA tenderness. Musculoskeletal: Full range of motion to all extremities. No gross deformities appreciated.  Visualization of the left ribs reveals mild edema about ribs 8 through 11.  This is very localized measuring no more than 2 cm wide.  No ecchymosis.  No lacerations or abrasions.  Palpation reveals tenderness over this skin finding.  No underlying osseous abnormality on palpation.  Good underlying breath sounds bilaterally.  No paradoxical chest wall movement.   No subcutaneous emphysema. Neurologic:  Normal speech and language. No gross focal neurologic deficits are appreciated.  Skin:  Skin is warm, dry and intact. No rash noted. Psychiatric: Mood and affect are normal. Speech and behavior are normal. Patient exhibits appropriate insight and judgement.   ____________________________________________   LABS (all labs ordered are listed, but only abnormal results are displayed)  Labs Reviewed - No data to display ____________________________________________  EKG   ____________________________________________  RADIOLOGY I personally viewed and evaluated these images as part of my medical decision making, as well as reviewing the written report by the radiologist.  DG Chest 2 View  Result Date: 08/02/2019 CLINICAL DATA:  08/04/2019,  left chest wall injury EXAM: CHEST - 2 VIEW COMPARISON:  05/22/2018 FINDINGS: Frontal and lateral views of the chest are obtained. The cardiac silhouette is unremarkable. No airspace disease, effusion, or pneumothorax. Stable background emphysema. There are prior healed left posterolateral fourth through seventh rib fractures. No acute bony abnormalities. Stable postsurgical changes within the cervical spine. IMPRESSION: 1. No acute intrathoracic process.  Stable background emphysema. Electronically Signed   By: Sharlet Salina M.D.   On: 08/02/2019 18:53    ____________________________________________    PROCEDURES  Procedure(s) performed:    Procedures    Medications  predniSONE (DELTASONE) tablet 60 mg (has no administration in time range)  meloxicam (MOBIC) tablet 15 mg (has no administration in time range)     ____________________________________________   INITIAL IMPRESSION / ASSESSMENT AND PLAN / ED COURSE  Pertinent labs & imaging results that were available during my care of the patient were reviewed by me and considered in my medical decision making (see chart for details).  Review of the [place]  CSRS was performed in accordance of the [facility] prior to dispensing any controlled drugs.           Patient's diagnosis is consistent with rib contusion.  Patient presented to the emergency department after a fall, striking the left ribs on a cooler.  Patient did not hit his head or lose consciousness.  His only complaint at this time is left rib pain.  Exam was reassuring with no absent lung sounds.  No paradoxical chest wall movement or crepitus.  Imaging reveals no fractures.  Patient is stable at this time and I do not feel further work-up is deemed necessary.  Patient will be prescribed short course of steroid, anti-inflammatory and pain medication for symptom relief.  Follow-up primary care as needed..  Patient is given ED precautions to return to the ED for any worsening or new symptoms.     ____________________________________________  FINAL CLINICAL IMPRESSION(S) / ED DIAGNOSES  Final diagnoses:  Fall, initial encounter  Contusion of rib on left side, initial encounter      NEW MEDICATIONS STARTED DURING THIS VISIT:  ED Discharge Orders         Ordered    meloxicam (MOBIC) 15 MG tablet  Daily     08/02/19 1926    predniSONE (DELTASONE) 50 MG tablet  Daily with breakfast     08/02/19 1926    HYDROcodone-acetaminophen (NORCO/VICODIN) 5-325 MG tablet  Every 4 hours PRN     08/02/19 1926              This chart was dictated using voice recognition software/Dragon. Despite best efforts to proofread, errors can occur which can change the meaning. Any change was purely unintentional.    Racheal Patches, PA-C 08/02/19 1926    Miguel Aschoff., MD 08/03/19 [phone]

## 2019-08-02 NOTE — ED Triage Notes (Signed)
FIRST NURSE NOTE:  Pt c/o cracked ribs on the left side, states it happened Friday after he tripped over a cane at home.  States he feels it moving around.

## 2019-08-02 NOTE — ED Triage Notes (Signed)
Pt fell in kitchen and fell on cooler causing injury to left rib cage - reports pain to left hand/wrist

## 2019-08-13 ENCOUNTER — Encounter: Payer: Self-pay | Admitting: *Deleted

## 2019-08-24 ENCOUNTER — Telehealth: Payer: Self-pay | Admitting: *Deleted

## 2019-08-24 DIAGNOSIS — Z87891 Personal history of nicotine dependence: Secondary | ICD-10-CM

## 2019-08-24 NOTE — Telephone Encounter (Signed)
Patient has been notified that annual lung cancer screening low dose CT scan is due currently or will be in near future. Confirmed that patient is within the age range of 55-77, and asymptomatic, (no signs or symptoms of lung cancer). Patient denies illness that would prevent curative treatment for lung cancer if found. Verified smoking history, (current, 43.75 pack year). The shared decision making visit was done 10/15/17. Patient is agreeable for CT scan being scheduled.

## 2019-08-27 ENCOUNTER — Ambulatory Visit: Payer: Medicaid Other

## 2019-08-27 ENCOUNTER — Ambulatory Visit: Admission: RE | Admit: 2019-08-27 | Payer: Medicaid Other | Source: Ambulatory Visit

## 2019-10-13 ENCOUNTER — Other Ambulatory Visit: Payer: Self-pay

## 2019-10-13 ENCOUNTER — Encounter (INDEPENDENT_AMBULATORY_CARE_PROVIDER_SITE_OTHER): Payer: Self-pay | Admitting: Vascular Surgery

## 2019-10-13 ENCOUNTER — Ambulatory Visit (INDEPENDENT_AMBULATORY_CARE_PROVIDER_SITE_OTHER): Payer: Medicaid Other | Admitting: Vascular Surgery

## 2019-10-13 VITALS — BP 114/75 | HR 86 | Resp 16 | Wt 165.0 lb

## 2019-10-13 DIAGNOSIS — Z86718 Personal history of other venous thrombosis and embolism: Secondary | ICD-10-CM | POA: Diagnosis not present

## 2019-10-13 DIAGNOSIS — I87009 Postthrombotic syndrome without complications of unspecified extremity: Secondary | ICD-10-CM | POA: Diagnosis not present

## 2019-10-13 DIAGNOSIS — Z95828 Presence of other vascular implants and grafts: Secondary | ICD-10-CM | POA: Diagnosis not present

## 2019-10-13 NOTE — Progress Notes (Signed)
MRN : [id number]  [name] is a 59 y.o. (1961-03-30) male who presents with chief complaint of  Chief Complaint  Patient presents with  . Follow-up    ref Meeler fractured strut of ivc filter  .  History of Present Illness: Patient returns today in follow up on referral from Dr. [place] for recent plain x-ray which suggested a potential fractured strut of an IVC filter.  This was placed many years ago and actually back in 2018 he had a procedure which involved IVC recanalization for chronic occlusion and the filter was in place at that time and embedded in the caval wall and not retrievable.  The patient's legs are actually doing great.  He is wearing his compression stockings daily and elevating his legs.  His swelling is fairly mild at this point and much better than when I last saw him in 2019.  He is not having any chest pain or shortness of breath.  His biggest issue currently is his back pain which is what the x-rays were for.  Current Outpatient Medications  Medication Sig Dispense Refill  . albuterol (VENTOLIN HFA) 108 (90 Base) MCG/ACT inhaler SMARTSIG:1-2 Puff(s) By Mouth Every 4 Hours PRN    . cyclobenzaprine (FLEXERIL) 5 MG tablet 1/2-1 po bid prn    . ELIQUIS 5 MG TABS tablet TAKE 1 TABLET BY MOUTH 2 TIMES DAILY 60 tablet 3  . gabapentin (NEURONTIN) 100 MG capsule 1 po qHS    . hydroxychloroquine (PLAQUENIL) 200 MG tablet One tab twice a day, 30 days    . loratadine (CLARITIN) 10 MG tablet Take 10 mg by mouth daily.    . Multiple Vitamins-Minerals (MULTIVITAMIN WITH MINERALS) tablet Take 1 tablet by mouth daily.    Marland Kitchen SPIRIVA HANDIHALER 18 MCG inhalation capsule 1 capsule daily.    . cephALEXin (KEFLEX) 500 MG capsule Take 1 capsule (500 mg total) by mouth 3 (three) times daily. (Patient not taking: Reported on 10/13/2019) 21 capsule 0  . HYDROcodone-acetaminophen (NORCO/VICODIN) 5-325 MG tablet Take 1 tablet by mouth every 4 (four) hours as needed for moderate pain. (Patient  not taking: Reported on 10/13/2019) 20 tablet 0  . meloxicam (MOBIC) 15 MG tablet Take 1 tablet (15 mg total) by mouth daily. (Patient not taking: Reported on 10/13/2019) 30 tablet 0  . predniSONE (DELTASONE) 50 MG tablet Take 1 tablet (50 mg total) by mouth daily with breakfast. (Patient not taking: Reported on 10/13/2019) 5 tablet 0   No current facility-administered medications for this visit.    Past Medical History:  Diagnosis Date  . DVT (deep venous thrombosis) ([facility])   . Hepatitis    hep C, planning on treating with medication.  Marland Kitchen History of chickenpox     Past Surgical History:  Procedure Laterality Date  . APPENDECTOMY    . IVC FILTER PLACEMENT ([facility] HX)  2009  . IVC FILTER REMOVAL N/A 02/11/2017   Procedure: IVC FILTER REMOVAL;  Surgeon: Annice Needy, MD;  Location: [facility];  Service: Cardiovascular;  Laterality: N/A;  . LUNG SURGERY Right 2009   resection, old pneumonia scar  . NECK SURGERY  1982   fusion C6-7  . SHOULDER ARTHROSCOPY WITH OPEN ROTATOR CUFF REPAIR Right 05/10/2016   Procedure: SHOULDER ARTHROSCOPY WITH DEBRIDEMENT, DECOMPRESSION, AND OPEN ROTATOR CUFF REPAIR;  Surgeon: Christena Flake, MD;  Location: [facility] OR[facility];  Service: Orthopedics;  Laterality: Right;  . TONSILLECTOMY       Social History  Tobacco Use  . Smoking status: Current Every Day Smoker    Packs/day: 1.00    Years: 43.00    Pack years: 43.00    Types: Cigarettes  . Smokeless tobacco: Never Used  Substance Use Topics  . Alcohol use: Yes    Comment: 1/2 pint every day   . Drug use: Yes    Frequency: 2.0 times per week    Types: Marijuana    Comment: every day       Family History  Problem Relation Age of Onset  . Heart attack Father   . Heart attack Brother   . Prostate cancer Neg Hx   . Bladder Cancer Neg Hx   . Kidney cancer Neg Hx      No Known Allergies   REVIEW OF SYSTEMS (Negative unless checked)  Constitutional: [] Weight loss  [] Fever  [] Chills Cardiac:  [] Chest pain   [] Chest pressure   [] Palpitations   [] Shortness of breath when laying flat   [] Shortness of breath at rest   [] Shortness of breath with exertion. Vascular:  [] Pain in legs with walking   [] Pain in legs at rest   [] Pain in legs when laying flat   [] Claudication   [] Pain in feet when walking  [] Pain in feet at rest  [] Pain in feet when laying flat   [x] History of DVT   [] Phlebitis   [x] Swelling in legs   [] Varicose veins   [] Non-healing ulcers Pulmonary:   [] Uses home oxygen   [] Productive cough   [] Hemoptysis   [] Wheeze  [] COPD   [] Asthma Neurologic:  [] Dizziness  [] Blackouts   [] Seizures   [] History of stroke   [] History of TIA  [] Aphasia   [] Temporary blindness   [] Dysphagia   [] Weakness or numbness in arms   [] Weakness or numbness in legs Musculoskeletal:  [] Arthritis   [] Joint swelling   [] Joint pain   [] Low back pain Hematologic:  [] Easy bruising  [] Easy bleeding   [] Hypercoagulable state   [] Anemic   Gastrointestinal:  [] Blood in stool   [] Vomiting blood  [] Gastroesophageal reflux/heartburn   [] Abdominal pain Genitourinary:  [] Chronic kidney disease   [] Difficult urination  [] Frequent urination  [] Burning with urination   [] Hematuria X varicocele Skin:  [] Rashes   [] Ulcers   [] Wounds Psychological:  [] History of anxiety   []  History of major depression.  Physical Examination  BP 114/75 (BP Location: Right Arm)   Pulse 86   Resp 16   Wt 165 lb (74.8 kg)   BMI 21.18 kg/m  Gen:  WD/WN, NAD Head: [place]/AT, No temporalis wasting. Ear/Nose/Throat: Hearing grossly intact, nares w/o erythema or drainage Eyes: Conjunctiva clear. Sclera non-icteric Neck: Supple.  Trachea midline Pulmonary:  Good air movement, no use of accessory muscles.  Cardiac: RRR, no JVD Vascular:  Vessel Right Left  Radial Palpable Palpable                          PT Palpable Palpable  DP Palpable Palpable    Musculoskeletal: M/S 5/5 throughout.  No deformity or atrophy.  Mild bilateral lower  extremity edema. Neurologic: Sensation grossly intact in extremities.  Symmetrical.  Speech is fluent.  Psychiatric: Judgment intact, Mood & affect appropriate for pt's clinical situation. Dermatologic: No rashes or ulcers noted.  No cellulitis or open wounds.       Labs No results found for this or any previous visit (from the past 2160 hour(s)).  Radiology No results found.  Assessment/Plan History of  DVT (deep vein thrombosis) On chronic anticoagulation. Tolerating this well. Postphlebitic symptoms have gotten much better over time with compression stockings.  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 On chronic anticoagulation in the form of Eliquis. Tolerating this well. Postphlebitic symptoms have gotten much better over time with compression stockings. Should continue to use these. I will plan to see him back in 1 year.  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  I have not been able to view the images from [place] [facility] from last month, but I suspect they look similar to years ago.  I will try to get these images available to view, but do not feel there is any possibility of safely taking out this IVC filter.  There is extremely low risk of embolization of this filter due to the chronic IVC occlusion.  At this point, symptomatically his legs are doing better and there is really not much we can do about this filter safely short of a major vena cava reconstruction which would be incredibly morbid and not recommended except in a lifesaving situation.    Leotis Pain, MD  10/13/2019 11:24 AM    This note was created with Dragon medical transcription system.  Any errors from dictation are purely unintentional

## 2019-10-13 NOTE — Assessment & Plan Note (Addendum)
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  I have not been able to view the images from [place] [facility] from last month, but I suspect they look similar to years ago.  I will try to get these images available to view, but do not feel there is any possibility of safely taking out this IVC filter.  There is extremely low risk of embolization of this filter due to the chronic IVC occlusion.  At this point, symptomatically his legs are doing better and there is really not much we can do about this filter safely short of a major vena cava reconstruction which would be incredibly morbid and not recommended except in a lifesaving situation.

## 2019-11-19 ENCOUNTER — Ambulatory Visit: Admission: RE | Admit: 2019-11-19 | Payer: Medicaid Other | Source: Ambulatory Visit

## 2020-03-23 ENCOUNTER — Encounter: Payer: Self-pay | Admitting: *Deleted

## 2020-04-07 ENCOUNTER — Telehealth: Payer: Self-pay | Admitting: *Deleted

## 2020-04-07 NOTE — Telephone Encounter (Signed)
Patient called and is to be scheduled for lung screening scan next week.

## 2020-04-12 ENCOUNTER — Ambulatory Visit: Payer: Medicaid Other

## 2020-05-07 DEATH — deceased

## 2020-10-11 ENCOUNTER — Ambulatory Visit (INDEPENDENT_AMBULATORY_CARE_PROVIDER_SITE_OTHER): Payer: Medicaid Other | Admitting: Vascular Surgery
